# Patient Record
Sex: Female | Born: 1980
Health system: Southern US, Community
[De-identification: ages and names within clinical notes are randomized; demographics above are authoritative.]

## PROBLEM LIST (undated history)

## (undated) DIAGNOSIS — F32A Depression, unspecified: Secondary | ICD-10-CM

## (undated) DIAGNOSIS — F329 Major depressive disorder, single episode, unspecified: Secondary | ICD-10-CM

## (undated) DIAGNOSIS — Z8774 Personal history of (corrected) congenital malformations of heart and circulatory system: Secondary | ICD-10-CM

## (undated) DIAGNOSIS — J309 Allergic rhinitis, unspecified: Secondary | ICD-10-CM

## (undated) DIAGNOSIS — T7840XA Allergy, unspecified, initial encounter: Secondary | ICD-10-CM

## (undated) DIAGNOSIS — Q211 Atrial septal defect, unspecified: Secondary | ICD-10-CM

## (undated) DIAGNOSIS — M1990 Unspecified osteoarthritis, unspecified site: Secondary | ICD-10-CM

## (undated) DIAGNOSIS — E559 Vitamin D deficiency, unspecified: Secondary | ICD-10-CM

## (undated) DIAGNOSIS — Z8742 Personal history of other diseases of the female genital tract: Secondary | ICD-10-CM

## (undated) DIAGNOSIS — R519 Headache, unspecified: Secondary | ICD-10-CM

## (undated) DIAGNOSIS — IMO0002 Reserved for concepts with insufficient information to code with codable children: Secondary | ICD-10-CM

## (undated) DIAGNOSIS — R87619 Unspecified abnormal cytological findings in specimens from cervix uteri: Secondary | ICD-10-CM

## (undated) DIAGNOSIS — R51 Headache: Secondary | ICD-10-CM

## (undated) DIAGNOSIS — K7689 Other specified diseases of liver: Secondary | ICD-10-CM

## (undated) DIAGNOSIS — F410 Panic disorder [episodic paroxysmal anxiety] without agoraphobia: Secondary | ICD-10-CM

## (undated) DIAGNOSIS — E7800 Pure hypercholesterolemia, unspecified: Secondary | ICD-10-CM

## (undated) DIAGNOSIS — G478 Other sleep disorders: Secondary | ICD-10-CM

## (undated) DIAGNOSIS — O210 Mild hyperemesis gravidarum: Secondary | ICD-10-CM

## (undated) DIAGNOSIS — G4763 Sleep related bruxism: Secondary | ICD-10-CM

## (undated) HISTORY — DX: Unspecified osteoarthritis, unspecified site: M19.90

## (undated) HISTORY — DX: Pure hypercholesterolemia, unspecified: E78.00

## (undated) HISTORY — DX: Reserved for concepts with insufficient information to code with codable children: IMO0002

## (undated) HISTORY — DX: Panic disorder (episodic paroxysmal anxiety): F41.0

## (undated) HISTORY — DX: Personal history of (corrected) congenital malformations of heart and circulatory system: Z87.74

## (undated) HISTORY — DX: Headache, unspecified: R51.9

## (undated) HISTORY — DX: Other specified diseases of liver: K76.89

## (undated) HISTORY — DX: Mild hyperemesis gravidarum: O21.0

## (undated) HISTORY — PX: COLPOSCOPY: SHX161

## (undated) HISTORY — DX: Allergic rhinitis, unspecified: J30.9

## (undated) HISTORY — DX: Sleep related bruxism: G47.63

## (undated) HISTORY — DX: Depression, unspecified: F32.A

## (undated) HISTORY — DX: Personal history of other diseases of the female genital tract: Z87.42

## (undated) HISTORY — PX: ASD REPAIR: SHX258

## (undated) HISTORY — DX: Atrial septal defect, unspecified: Q21.10

## (undated) HISTORY — DX: Major depressive disorder, single episode, unspecified: F32.9

## (undated) HISTORY — DX: Atrial septal defect: Q21.1

## (undated) HISTORY — DX: Vitamin D deficiency, unspecified: E55.9

## (undated) HISTORY — DX: Headache: R51

## (undated) HISTORY — DX: Other sleep disorders: G47.8

## (undated) HISTORY — DX: Allergy, unspecified, initial encounter: T78.40XA

## (undated) HISTORY — DX: Unspecified abnormal cytological findings in specimens from cervix uteri: R87.619

---

## 2007-01-12 ENCOUNTER — Emergency Department (HOSPITAL_COMMUNITY): Admission: EM | Admit: 2007-01-12 | Discharge: 2007-01-12 | Payer: Self-pay | Admitting: Family Medicine

## 2007-04-28 ENCOUNTER — Emergency Department (HOSPITAL_COMMUNITY): Admission: EM | Admit: 2007-04-28 | Discharge: 2007-04-28 | Payer: Self-pay | Admitting: Emergency Medicine

## 2007-05-01 ENCOUNTER — Encounter: Admission: RE | Admit: 2007-05-01 | Discharge: 2007-05-01 | Payer: Self-pay | Admitting: Internal Medicine

## 2011-08-16 LAB — URINALYSIS, ROUTINE W REFLEX MICROSCOPIC
Glucose, UA: NEGATIVE
Ketones, ur: NEGATIVE
Nitrite: NEGATIVE
Protein, ur: NEGATIVE
Urobilinogen, UA: 0.2

## 2011-08-16 LAB — HEPATIC FUNCTION PANEL
ALT: 12
AST: 22
Alkaline Phosphatase: 34 — ABNORMAL LOW
Bilirubin, Direct: <0.1

## 2011-08-16 LAB — I-STAT 8, (EC8 V) (CONVERTED LAB)
Acid-base deficit: 1
Chloride: 107
Glucose, Bld: 95
Potassium: 4.2
TCO2: 27
pCO2, Ven: 46.1
pH, Ven: 7.349 — ABNORMAL HIGH

## 2011-08-16 LAB — CBC
HCT: 37
Hemoglobin: 12.6
MCHC: 34
MCV: 93.6
RBC: 3.95
WBC: 5.1

## 2011-08-16 LAB — POCT CARDIAC MARKERS
CKMB, poc: <1 — ABNORMAL LOW
Myoglobin, poc: 23.9
Operator id: 263631

## 2011-08-16 LAB — DIFFERENTIAL
Basophils Relative: 0
Eosinophils Absolute: 0
Lymphs Abs: 1.9
Monocytes Absolute: 0.5
Monocytes Relative: 9

## 2011-08-16 LAB — POCT PREGNANCY, URINE: Preg Test, Ur: NEGATIVE

## 2011-08-16 LAB — SEDIMENTATION RATE: Sed Rate: 2

## 2011-08-16 LAB — POCT I-STAT CREATININE: Operator id: 263631

## 2013-02-27 LAB — OB RESULTS CONSOLE RPR: RPR: NONREACTIVE

## 2013-02-27 LAB — OB RESULTS CONSOLE HIV ANTIBODY (ROUTINE TESTING): HIV: NONREACTIVE

## 2013-02-27 LAB — OB RESULTS CONSOLE GC/CHLAMYDIA
Chlamydia: NEGATIVE
Gonorrhea: NEGATIVE

## 2013-02-27 LAB — OB RESULTS CONSOLE RUBELLA ANTIBODY, IGM: Rubella: NON-IMMUNE/NOT IMMUNE

## 2013-03-28 ENCOUNTER — Inpatient Hospital Stay (HOSPITAL_COMMUNITY): Admission: AD | Admit: 2013-03-28 | Payer: Self-pay | Source: Ambulatory Visit | Admitting: Obstetrics and Gynecology

## 2013-09-23 LAB — OB RESULTS CONSOLE GBS: GBS: NEGATIVE

## 2013-10-29 ENCOUNTER — Telehealth (HOSPITAL_COMMUNITY): Payer: Self-pay | Admitting: *Deleted

## 2013-10-29 ENCOUNTER — Encounter (HOSPITAL_COMMUNITY): Payer: Self-pay | Admitting: *Deleted

## 2013-10-29 ENCOUNTER — Other Ambulatory Visit: Payer: Self-pay | Admitting: Obstetrics and Gynecology

## 2013-10-29 NOTE — Telephone Encounter (Signed)
Preadmission screen  

## 2013-11-01 ENCOUNTER — Inpatient Hospital Stay (HOSPITAL_COMMUNITY)
Admission: RE | Admit: 2013-11-01 | Discharge: 2013-11-05 | DRG: 765 | Disposition: A | Discharge status: Home / Self Care | Payer: BC Managed Care – PPO | Source: Ambulatory Visit | Attending: Obstetrics and Gynecology | Admitting: Obstetrics and Gynecology

## 2013-11-01 ENCOUNTER — Encounter (HOSPITAL_COMMUNITY): Payer: Self-pay

## 2013-11-01 DIAGNOSIS — O480 Post-term pregnancy: Principal | ICD-10-CM | POA: Diagnosis present

## 2013-11-01 DIAGNOSIS — D62 Acute posthemorrhagic anemia: Secondary | ICD-10-CM | POA: Diagnosis not present

## 2013-11-01 DIAGNOSIS — O9903 Anemia complicating the puerperium: Secondary | ICD-10-CM | POA: Diagnosis not present

## 2013-11-01 LAB — CBC
HCT: 36.7 % (ref 36.0–46.0)
Hemoglobin: 12.2 g/dL (ref 12.0–15.0)
MCH: 30.8 pg (ref 26.0–34.0)
MCHC: 33.2 g/dL (ref 30.0–36.0)
MCV: 92.7 fL (ref 78.0–100.0)
Platelets: 181 10*3/uL (ref 150–400)
RBC: 3.96 MIL/uL (ref 3.87–5.11)
RDW: 16.1 % — AB (ref 11.5–15.5)
WBC: 9.6 10*3/uL (ref 4.0–10.5)

## 2013-11-01 MED ORDER — IBUPROFEN 600 MG PO TABS
600.0000 mg | ORAL_TABLET | Freq: Four times a day (QID) | ORAL | Status: DC | PRN
Start: 2013-11-01 — End: 2013-11-03

## 2013-11-01 MED ORDER — OXYCODONE-ACETAMINOPHEN 5-325 MG PO TABS: 1.0000 | ORAL_TABLET | ORAL | Status: DC | PRN

## 2013-11-01 MED ORDER — ACETAMINOPHEN 325 MG PO TABS: 650.0000 mg | ORAL_TABLET | ORAL | Status: DC | PRN

## 2013-11-01 MED ORDER — ONDANSETRON HCL 4 MG/2ML IJ SOLN
4.0000 mg | Freq: Four times a day (QID) | INTRAMUSCULAR | Status: DC | PRN
  Administered 2013-11-02 – 2013-11-03 (×2): 4 mg via INTRAVENOUS
  Filled 2013-11-01 (×2): qty 2

## 2013-11-01 MED ORDER — LACTATED RINGERS IV SOLN
500.0000 mL | INTRAVENOUS | Status: DC | PRN
  Administered 2013-11-02: 1000 mL via INTRAVENOUS

## 2013-11-01 MED ORDER — ZOLPIDEM TARTRATE 5 MG PO TABS: 5.0000 mg | ORAL_TABLET | Freq: Every evening | ORAL | Status: DC | PRN

## 2013-11-01 MED ORDER — LIDOCAINE HCL (PF) 1 % IJ SOLN: 30.0000 mL | INTRAMUSCULAR | Status: DC | PRN

## 2013-11-01 MED ORDER — MISOPROSTOL 25 MCG QUARTER TABLET
25.0000 ug | ORAL_TABLET | ORAL | Status: DC | PRN
  Administered 2013-11-01 – 2013-11-02 (×2): 25 ug via VAGINAL
  Filled 2013-11-01 (×2): qty 0.25

## 2013-11-01 MED ORDER — CITRIC ACID-SODIUM CITRATE 334-500 MG/5ML PO SOLN
30.0000 mL | ORAL | Status: DC | PRN
  Administered 2013-11-03: 30 mL via ORAL
  Filled 2013-11-01: qty 15

## 2013-11-01 MED ORDER — LACTATED RINGERS IV SOLN
INTRAVENOUS | Status: DC
Start: 2013-11-01 — End: 2013-11-03
  Administered 2013-11-01: 20:00:00 via INTRAVENOUS
  Administered 2013-11-02: 1000 mL via INTRAVENOUS
  Administered 2013-11-02 – 2013-11-03 (×4): via INTRAVENOUS

## 2013-11-01 MED ORDER — BUTORPHANOL TARTRATE 1 MG/ML IJ SOLN
1.0000 mg | INTRAMUSCULAR | Status: DC | PRN
  Administered 2013-11-02 (×2): 1 mg via INTRAVENOUS
  Filled 2013-11-01 (×2): qty 1

## 2013-11-01 MED ORDER — TERBUTALINE SULFATE 1 MG/ML IJ SOLN: 0.2500 mg | Freq: Once | INTRAMUSCULAR | Status: AC | PRN

## 2013-11-01 MED ORDER — OXYTOCIN 40 UNITS IN LACTATED RINGERS INFUSION - SIMPLE MED
62.5000 mL/h | INTRAVENOUS | Status: DC
Start: 2013-11-01 — End: 2013-11-03

## 2013-11-01 MED ORDER — OXYTOCIN 10 UNIT/ML IJ SOLN: 10.0000 [IU] | Freq: Once | INTRAMUSCULAR | Status: DC

## 2013-11-01 MED ORDER — OXYTOCIN BOLUS FROM INFUSION: 500.0000 mL | INTRAVENOUS | Status: DC

## 2013-11-01 NOTE — H&P (Signed)
Holly Robles is a 33 y.o. female presenting for  IOL 2nd to postdates.. History OB History   Grav Para Term Preterm Abortions TAB SAB Ect Mult Living   1              Past Medical History  Diagnosis Date  . Depression   . Hyperemesis gravidarum   . Abnormal Pap smear   . Hx of female infertility   . Headache(784.0)     migraine  . ASD (atrial septal defect)    Past Surgical History  Procedure Laterality Date  . Asd repair    . Colposcopy     Family History: family history includes Cancer in her maternal grandmother and paternal grandmother; Diabetes in her maternal uncle; Holly Robles' disease in her brother; Heart attack in her maternal grandfather; Heart disease in her maternal grandfather and paternal grandfather; Heart failure in her paternal grandfather; Hyperlipidemia in her brother and father; Hypertension in her brother; Hyperthyroidism in her brother; Hypothyroidism in her mother. Social History:  reports that she has never smoked. She has never used smokeless tobacco. She reports that she does not drink alcohol or use illicit drugs.   Prenatal Transfer Tool  Maternal Diabetes: No Genetic Screening: Declined Maternal Ultrasounds/Referrals: Normal Fetal Ultrasounds or other Referrals:  Fetal echo Maternal Substance Abuse:  No Significant Maternal Medications:  None Significant Maternal Lab Results:  Lab values include: Group B Strep negative Other Comments:  maternal hx ASD repair  ROS neg    Blood pressure 124/91, pulse 93, height 5\' 4"  (1.626 m), weight 81.647 kg (180 lb), last menstrual period 01/19/2013. Exam Physical Exam  Constitutional: She is oriented to person, place, and time. She appears well-developed and well-nourished.  HENT:  Head: Atraumatic.  Neck: Neck supple.  Cardiovascular: Normal rate.   Respiratory: Breath sounds normal.  GI: Soft.  Musculoskeletal: She exhibits no edema.  Neurological: She is oriented to person, place, and time.  Skin:  Skin is warm and dry.  Psychiatric: She has a normal mood and affect.   VE closed/thick/-3 Prenatal labs: ABO, Rh: A/Positive/-- (05/01 0000) Antibody: Negative (05/01 0000) Rubella: Nonimmune (05/01 0000) RPR: Nonreactive (05/01 0000)  HBsAg: Negative (05/01 0000)  HIV: Non-reactive (05/01 0000)  GBS: Negative (11/25 0000)   Assessment/Plan: Postdates P) admit routine labs, cytotec. Analgesic prn pitocin prn  Annaleese Guier A 11/01/2013, 7:56 PM

## 2013-11-01 NOTE — Plan of Care (Signed)
Problem: Consults Goal: Birthing Suites Patient Information Press F2 to bring up selections list Outcome: Completed/Met Date Met:  11/01/13  Pt > [redacted] weeks EGA and Inpatient induction

## 2013-11-02 ENCOUNTER — Encounter (HOSPITAL_COMMUNITY): Payer: BC Managed Care – PPO | Admitting: Anesthesiology

## 2013-11-02 ENCOUNTER — Inpatient Hospital Stay (HOSPITAL_COMMUNITY): Payer: BC Managed Care – PPO | Admitting: Anesthesiology

## 2013-11-02 ENCOUNTER — Encounter (HOSPITAL_COMMUNITY): Payer: Self-pay

## 2013-11-02 MED ORDER — LACTATED RINGERS IV SOLN: 500.0000 mL | Freq: Once | INTRAVENOUS | Status: DC

## 2013-11-02 MED ORDER — FENTANYL 2.5 MCG/ML BUPIVACAINE 1/10 % EPIDURAL INFUSION (WH - ANES)
14.0000 mL/h | INTRAMUSCULAR | Status: DC | PRN
  Administered 2013-11-02 – 2013-11-03 (×2): 14 mL/h via EPIDURAL
  Filled 2013-11-02 (×3): qty 125

## 2013-11-02 MED ORDER — PHENYLEPHRINE 40 MCG/ML (10ML) SYRINGE FOR IV PUSH (FOR BLOOD PRESSURE SUPPORT)
80.0000 ug | PREFILLED_SYRINGE | INTRAVENOUS | Status: DC | PRN
  Administered 2013-11-02: 80 ug via INTRAVENOUS
  Filled 2013-11-02: qty 10

## 2013-11-02 MED ORDER — OXYTOCIN 40 UNITS IN LACTATED RINGERS INFUSION - SIMPLE MED
1.0000 m[IU]/min | INTRAVENOUS | Status: DC
  Administered 2013-11-02: 2 m[IU]/min via INTRAVENOUS
  Administered 2013-11-02: 4 m[IU]/min via INTRAVENOUS
  Filled 2013-11-02: qty 1000

## 2013-11-02 MED ORDER — EPHEDRINE 5 MG/ML INJ
10.0000 mg | INTRAVENOUS | Status: DC | PRN
  Filled 2013-11-02 (×2): qty 4

## 2013-11-02 MED ORDER — FENTANYL CITRATE 0.05 MG/ML IJ SOLN
INTRAMUSCULAR | Status: AC
  Filled 2013-11-02: qty 2

## 2013-11-02 MED ORDER — EPHEDRINE 5 MG/ML INJ
10.0000 mg | INTRAVENOUS | Status: DC | PRN
  Filled 2013-11-02: qty 4

## 2013-11-02 MED ORDER — TERBUTALINE SULFATE 1 MG/ML IJ SOLN: 0.2500 mg | Freq: Once | INTRAMUSCULAR | Status: AC | PRN

## 2013-11-02 MED ORDER — PHENYLEPHRINE 40 MCG/ML (10ML) SYRINGE FOR IV PUSH (FOR BLOOD PRESSURE SUPPORT)
80.0000 ug | PREFILLED_SYRINGE | INTRAVENOUS | Status: DC | PRN
  Filled 2013-11-02 (×2): qty 10

## 2013-11-02 MED ORDER — DIPHENHYDRAMINE HCL 50 MG/ML IJ SOLN: 12.5000 mg | INTRAMUSCULAR | Status: DC | PRN

## 2013-11-02 NOTE — Anesthesia Preprocedure Evaluation (Signed)
Anesthesia Evaluation  Patient identified by MRN, date of birth, ID band Patient awake    Reviewed: Allergy & Precautions, H&P , NPO status , Patient's Chart, lab work & pertinent test results  Airway Mallampati: II TM Distance: >3 FB Neck ROM: full    Dental no notable dental hx.    Pulmonary neg pulmonary ROS,    Pulmonary exam normal       Cardiovascular negative cardio ROS      Neuro/Psych    GI/Hepatic negative GI ROS, Neg liver ROS,   Endo/Other  negative endocrine ROS  Renal/GU negative Renal ROS  negative genitourinary   Musculoskeletal negative musculoskeletal ROS (+)   Abdominal Normal abdominal exam  (+)   Peds  Hematology negative hematology ROS (+)   Anesthesia Other Findings   Reproductive/Obstetrics (+) Pregnancy                           Anesthesia Physical Anesthesia Plan  ASA: II  Anesthesia Plan: Epidural   Post-op Pain Management:    Induction:   Airway Management Planned:   Additional Equipment:   Intra-op Plan:   Post-operative Plan:   Informed Consent: I have reviewed the patients History and Physical, chart, labs and discussed the procedure including the risks, benefits and alternatives for the proposed anesthesia with the patient or authorized representative who has indicated his/her understanding and acceptance.     Plan Discussed with:   Anesthesia Plan Comments:         Anesthesia Quick Evaluation

## 2013-11-02 NOTE — Progress Notes (Signed)
Barth KirksMeredith D Mallick is a 33 y.o. G1P0 at 501w0d by LMP admitted for induction of labor due to Post dates. Due date 12/28.  Subjective: No chief complaint on file.  Moaning with ctx c/o back pain Objective: BP 109/63  Pulse 79  Temp(Src) 97.8 F (36.6 C) (Oral)  Resp 20  Ht 5\' 4"  (1.626 m)  Wt 81.647 kg (180 lb)  BMI 30.88 kg/m2  LMP 01/19/2013      FHT:  FHR: 125 bpm, variability: moderate,  accelerations:  Present,  decelerations:  Absent UC:   regular, every 2 -3  minutes SVE:   2 cm dilated, 100% effaced, -3 station w/ balloon in place Tracing: cat 1  Labs: Lab Results  Component Value Date   WBC 9.6 11/01/2013   HGB 12.2 11/01/2013   HCT 36.7 11/01/2013   MCV 92.7 11/01/2013   PLT 181 11/01/2013    Assessment / Plan: Postdates P) cont present mgmt Anticipated MOD:  unknown  Miia Blanks A 11/02/2013, 1:57 PM

## 2013-11-02 NOTE — Progress Notes (Signed)
Holly Robles is a 33 y.o. G1P0 at 553w0d by LMP admitted for induction of labor due to Post dates. Due date 12/28.  Subjective: No chief complaint on file. s/p cytotec x 2 Pitocin 2 miu Breathing with ctx  Objective: BP 115/75  Pulse 79  Temp(Src) 98.2 F (36.8 C) (Oral)  Resp 20  Ht 5\' 4"  (1.626 m)  Wt 81.647 kg (180 lb)  BMI 30.88 kg/m2  LMP 01/19/2013      FHT:  FHR: 140 bpm, variability: moderate,  accelerations:  Present,  decelerations:  Absent UC:   irregular, every q2-4 minutes SVE:   1 cm dilated, 50%(thick) effaced, -3station Tracing: cat 1 Intracervical balloon placed  Labs: Lab Results  Component Value Date   WBC 9.6 11/01/2013   HGB 12.2 11/01/2013   HCT 36.7 11/01/2013   MCV 92.7 11/01/2013   PLT 181 11/01/2013    Assessment / Plan: Postdates P) continue pitocin. Amniotomy with more favorable cervix. Analgesic prn  Anticipated MOD:  unknown  Greycen Felter A 11/02/2013, 8:55 AM

## 2013-11-02 NOTE — Progress Notes (Signed)
Holly KirksMeredith D Robles is a 33 y.o. G1P0 at 3937w0d by LMP admitted for induction of labor due to Post dates. Due date 12/28.  Subjective: No chief complaint on file. Epidural SROM clear fluid @ 8:15p comfortable  Objective: BP 115/54  Pulse 79  Temp(Src) 98.4 F (36.9 C) (Oral)  Resp 20  Ht 5\' 4"  (1.626 m)  Wt 81.647 kg (180 lb)  BMI 30.88 kg/m2  SpO2 100%  LMP 01/19/2013   Total I/O In: -  Out: 200 [Urine:200]  FHT:  FHR: 140 bpm, variability: moderate,  accelerations:  Present,  decelerations:  Absent UC:   regular, every 2 1/2 -3 minutes SVE:   4 cm dilated, 80% effaced, -3 station Tracing: cat 1 IUPC placed  Labs: Lab Results  Component Value Date   WBC 9.6 11/01/2013   HGB 12.2 11/01/2013   HCT 36.7 11/01/2013   MCV 92.7 11/01/2013   PLT 181 11/01/2013    Assessment / Plan: Arrest in active phase of labor due to suboptimal ctx Postdates P) exaggerated right sims. Cont increase pitocin  Anticipated MOD:  unknown  Holly Robles A 11/02/2013, 9:59 PM

## 2013-11-03 ENCOUNTER — Encounter (HOSPITAL_COMMUNITY)
Admission: RE | Disposition: A | Discharge status: Home / Self Care | Payer: Self-pay | Source: Ambulatory Visit | Attending: Obstetrics and Gynecology

## 2013-11-03 ENCOUNTER — Encounter (HOSPITAL_COMMUNITY): Payer: Self-pay

## 2013-11-03 SURGERY — Surgical Case
Anesthesia: Epidural | Site: Abdomen

## 2013-11-03 MED ORDER — SIMETHICONE 80 MG PO CHEW
80.0000 mg | CHEWABLE_TABLET | ORAL | Status: DC
  Administered 2013-11-03 – 2013-11-04 (×2): 80 mg via ORAL
  Filled 2013-11-03 (×2): qty 1

## 2013-11-03 MED ORDER — SODIUM BICARBONATE 8.4 % IV SOLN
INTRAVENOUS | Status: DC | PRN
  Administered 2013-11-03 (×6): 5 mL via EPIDURAL

## 2013-11-03 MED ORDER — MIDAZOLAM HCL 2 MG/2ML IJ SOLN: 0.5000 mg | Freq: Once | INTRAMUSCULAR | Status: DC | PRN

## 2013-11-03 MED ORDER — DEXTROSE 5 % IN LACTATED RINGERS IV BOLUS
1000.0000 mL | Freq: Once | INTRAVENOUS | Status: AC
  Administered 2013-11-03: 1000 mL via INTRAVENOUS

## 2013-11-03 MED ORDER — 0.9 % SODIUM CHLORIDE (POUR BTL) OPTIME
TOPICAL | Status: DC | PRN
  Administered 2013-11-03: 1000 mL

## 2013-11-03 MED ORDER — MEPERIDINE HCL 25 MG/ML IJ SOLN: 6.2500 mg | INTRAMUSCULAR | Status: DC | PRN

## 2013-11-03 MED ORDER — KETOROLAC TROMETHAMINE 30 MG/ML IJ SOLN
30.0000 mg | Freq: Four times a day (QID) | INTRAMUSCULAR | Status: AC | PRN
Start: 2013-11-03 — End: 2013-11-04

## 2013-11-03 MED ORDER — ONDANSETRON HCL 4 MG/2ML IJ SOLN: 4.0000 mg | INTRAMUSCULAR | Status: DC | PRN

## 2013-11-03 MED ORDER — BUPIVACAINE HCL (PF) 0.25 % IJ SOLN
INTRAMUSCULAR | Status: DC | PRN
  Administered 2013-11-03: 9 mL

## 2013-11-03 MED ORDER — ONDANSETRON HCL 4 MG/2ML IJ SOLN
INTRAMUSCULAR | Status: AC
  Filled 2013-11-03: qty 2

## 2013-11-03 MED ORDER — NALBUPHINE HCL 10 MG/ML IJ SOLN
5.0000 mg | INTRAMUSCULAR | Status: DC | PRN
  Filled 2013-11-03: qty 1

## 2013-11-03 MED ORDER — METOCLOPRAMIDE HCL 5 MG/ML IJ SOLN
INTRAMUSCULAR | Status: DC | PRN
  Administered 2013-11-03 (×2): 5 mg via INTRAVENOUS

## 2013-11-03 MED ORDER — MEPERIDINE HCL 25 MG/ML IJ SOLN
6.2500 mg | INTRAMUSCULAR | Status: DC | PRN
Start: 2013-11-03 — End: 2013-11-03

## 2013-11-03 MED ORDER — IBUPROFEN 600 MG PO TABS: 600.0000 mg | ORAL_TABLET | Freq: Four times a day (QID) | ORAL | Status: DC | PRN

## 2013-11-03 MED ORDER — BUPIVACAINE HCL (PF) 0.25 % IJ SOLN
INTRAMUSCULAR | Status: AC
  Filled 2013-11-03: qty 30

## 2013-11-03 MED ORDER — BUPROPION HCL ER (XL) 150 MG PO TB24
150.0000 mg | ORAL_TABLET | Freq: Every day | ORAL | Status: DC
  Administered 2013-11-04 – 2013-11-05 (×2): 150 mg via ORAL
  Filled 2013-11-03 (×3): qty 1

## 2013-11-03 MED ORDER — DIPHENHYDRAMINE HCL 50 MG/ML IJ SOLN: 12.5000 mg | INTRAMUSCULAR | Status: DC | PRN

## 2013-11-03 MED ORDER — NALOXONE HCL 1 MG/ML IJ SOLN
1.0000 ug/kg/h | INTRAVENOUS | Status: DC | PRN
  Filled 2013-11-03: qty 2

## 2013-11-03 MED ORDER — WITCH HAZEL-GLYCERIN EX PADS: 1.0000 "application " | MEDICATED_PAD | CUTANEOUS | Status: DC | PRN

## 2013-11-03 MED ORDER — MEASLES, MUMPS & RUBELLA VAC ~~LOC~~ INJ
0.5000 mL | INJECTION | Freq: Once | SUBCUTANEOUS | Status: AC
  Administered 2013-11-05: 0.5 mL via SUBCUTANEOUS
  Filled 2013-11-03 (×2): qty 0.5

## 2013-11-03 MED ORDER — IBUPROFEN 600 MG PO TABS
600.0000 mg | ORAL_TABLET | Freq: Four times a day (QID) | ORAL | Status: DC
  Administered 2013-11-03 – 2013-11-05 (×7): 600 mg via ORAL
  Filled 2013-11-03 (×7): qty 1

## 2013-11-03 MED ORDER — KETOROLAC TROMETHAMINE 30 MG/ML IJ SOLN: 30.0000 mg | Freq: Four times a day (QID) | INTRAMUSCULAR | Status: AC | PRN

## 2013-11-03 MED ORDER — PHENYLEPHRINE 8 MG IN D5W 100 ML (0.08MG/ML) PREMIX OPTIME: INJECTION | INTRAVENOUS | Status: DC | PRN

## 2013-11-03 MED ORDER — KETOROLAC TROMETHAMINE 30 MG/ML IJ SOLN
15.0000 mg | Freq: Once | INTRAMUSCULAR | Status: AC | PRN
  Administered 2013-11-03: 30 mg via INTRAVENOUS

## 2013-11-03 MED ORDER — ZOLPIDEM TARTRATE 5 MG PO TABS: 5.0000 mg | ORAL_TABLET | Freq: Every evening | ORAL | Status: DC | PRN

## 2013-11-03 MED ORDER — ONDANSETRON HCL 4 MG/2ML IJ SOLN: 4.0000 mg | Freq: Three times a day (TID) | INTRAMUSCULAR | Status: DC | PRN

## 2013-11-03 MED ORDER — LANOLIN HYDROUS EX OINT: 1.0000 "application " | TOPICAL_OINTMENT | CUTANEOUS | Status: DC | PRN

## 2013-11-03 MED ORDER — FENTANYL CITRATE 0.05 MG/ML IJ SOLN
INTRAMUSCULAR | Status: DC | PRN
  Administered 2013-11-03: 100 ug via EPIDURAL

## 2013-11-03 MED ORDER — ACETAMINOPHEN 500 MG PO TABS
1000.0000 mg | ORAL_TABLET | Freq: Four times a day (QID) | ORAL | Status: AC
  Administered 2013-11-03 (×2): 1000 mg via ORAL
  Filled 2013-11-03 (×3): qty 2

## 2013-11-03 MED ORDER — PRENATAL MULTIVITAMIN CH
1.0000 | ORAL_TABLET | Freq: Every day | ORAL | Status: DC
  Administered 2013-11-03 – 2013-11-04 (×2): 1 via ORAL
  Filled 2013-11-03 (×2): qty 1

## 2013-11-03 MED ORDER — METHYLERGONOVINE MALEATE 0.2 MG PO TABS: 0.2000 mg | ORAL_TABLET | ORAL | Status: DC | PRN

## 2013-11-03 MED ORDER — LACTATED RINGERS IV SOLN
INTRAVENOUS | Status: DC | PRN
  Administered 2013-11-03: 08:00:00 via INTRAVENOUS

## 2013-11-03 MED ORDER — OXYCODONE-ACETAMINOPHEN 5-325 MG PO TABS
1.0000 | ORAL_TABLET | ORAL | Status: DC | PRN
  Administered 2013-11-04 – 2013-11-05 (×3): 1 via ORAL
  Filled 2013-11-03 (×3): qty 1

## 2013-11-03 MED ORDER — DIPHENHYDRAMINE HCL 50 MG/ML IJ SOLN: 25.0000 mg | INTRAMUSCULAR | Status: DC | PRN

## 2013-11-03 MED ORDER — SODIUM CHLORIDE 0.9 % IJ SOLN
3.0000 mL | Freq: Two times a day (BID) | INTRAMUSCULAR | Status: DC
  Administered 2013-11-03: 3 mL via INTRAVENOUS

## 2013-11-03 MED ORDER — DIPHENHYDRAMINE HCL 25 MG PO CAPS
25.0000 mg | ORAL_CAPSULE | Freq: Four times a day (QID) | ORAL | Status: DC | PRN
  Administered 2013-11-03: 25 mg via ORAL

## 2013-11-03 MED ORDER — OXYTOCIN 10 UNIT/ML IJ SOLN
40.0000 [IU] | INTRAVENOUS | Status: DC | PRN
  Administered 2013-11-03: 40 [IU] via INTRAVENOUS

## 2013-11-03 MED ORDER — DIBUCAINE 1 % RE OINT: 1.0000 "application " | TOPICAL_OINTMENT | RECTAL | Status: DC | PRN

## 2013-11-03 MED ORDER — FENTANYL CITRATE 0.05 MG/ML IJ SOLN: 25.0000 ug | INTRAMUSCULAR | Status: DC | PRN

## 2013-11-03 MED ORDER — SIMETHICONE 80 MG PO CHEW
80.0000 mg | CHEWABLE_TABLET | Freq: Three times a day (TID) | ORAL | Status: DC
  Administered 2013-11-03 – 2013-11-05 (×5): 80 mg via ORAL
  Filled 2013-11-03 (×5): qty 1

## 2013-11-03 MED ORDER — METHYLERGONOVINE MALEATE 0.2 MG/ML IJ SOLN: 0.2000 mg | INTRAMUSCULAR | Status: DC | PRN

## 2013-11-03 MED ORDER — ONDANSETRON HCL 4 MG PO TABS
4.0000 mg | ORAL_TABLET | ORAL | Status: DC | PRN
Start: 2013-11-03 — End: 2013-11-05

## 2013-11-03 MED ORDER — CEFAZOLIN SODIUM-DEXTROSE 2-3 GM-% IV SOLR
INTRAVENOUS | Status: AC
  Filled 2013-11-03: qty 50

## 2013-11-03 MED ORDER — SCOPOLAMINE 1 MG/3DAYS TD PT72
MEDICATED_PATCH | TRANSDERMAL | Status: AC
  Filled 2013-11-03: qty 1

## 2013-11-03 MED ORDER — FLEET ENEMA 7-19 GM/118ML RE ENEM: 1.0000 | ENEMA | Freq: Every day | RECTAL | Status: DC | PRN

## 2013-11-03 MED ORDER — BISACODYL 10 MG RE SUPP: 10.0000 mg | Freq: Every day | RECTAL | Status: DC | PRN

## 2013-11-03 MED ORDER — ONDANSETRON HCL 4 MG/2ML IJ SOLN
INTRAMUSCULAR | Status: DC | PRN
  Administered 2013-11-03: 4 mg via INTRAVENOUS

## 2013-11-03 MED ORDER — OXYTOCIN 40 UNITS IN LACTATED RINGERS INFUSION - SIMPLE MED: 62.5000 mL/h | INTRAVENOUS | Status: AC

## 2013-11-03 MED ORDER — SENNOSIDES-DOCUSATE SODIUM 8.6-50 MG PO TABS
2.0000 | ORAL_TABLET | ORAL | Status: DC
  Administered 2013-11-03 – 2013-11-04 (×2): 2 via ORAL
  Filled 2013-11-03 (×3): qty 2

## 2013-11-03 MED ORDER — PROMETHAZINE HCL 25 MG/ML IJ SOLN: 6.2500 mg | INTRAMUSCULAR | Status: DC | PRN

## 2013-11-03 MED ORDER — FERROUS SULFATE 325 (65 FE) MG PO TABS
325.0000 mg | ORAL_TABLET | Freq: Two times a day (BID) | ORAL | Status: DC
  Administered 2013-11-03 – 2013-11-05 (×4): 325 mg via ORAL
  Filled 2013-11-03 (×4): qty 1

## 2013-11-03 MED ORDER — OXYTOCIN 10 UNIT/ML IJ SOLN
INTRAMUSCULAR | Status: AC
  Filled 2013-11-03: qty 4

## 2013-11-03 MED ORDER — METOCLOPRAMIDE HCL 5 MG/ML IJ SOLN: 10.0000 mg | Freq: Three times a day (TID) | INTRAMUSCULAR | Status: DC | PRN

## 2013-11-03 MED ORDER — SODIUM CHLORIDE 0.9 % IJ SOLN
3.0000 mL | INTRAMUSCULAR | Status: DC | PRN
  Administered 2013-11-03: 3 mL via INTRAVENOUS

## 2013-11-03 MED ORDER — KETOROLAC TROMETHAMINE 30 MG/ML IJ SOLN
INTRAMUSCULAR | Status: AC
  Filled 2013-11-03: qty 1

## 2013-11-03 MED ORDER — CEFAZOLIN SODIUM-DEXTROSE 2-3 GM-% IV SOLR
INTRAVENOUS | Status: DC | PRN
  Administered 2013-11-03: 2 g via INTRAVENOUS

## 2013-11-03 MED ORDER — SCOPOLAMINE 1 MG/3DAYS TD PT72
1.0000 | MEDICATED_PATCH | Freq: Once | TRANSDERMAL | Status: DC
  Administered 2013-11-03: 1.5 mg via TRANSDERMAL

## 2013-11-03 MED ORDER — SIMETHICONE 80 MG PO CHEW: 80.0000 mg | CHEWABLE_TABLET | ORAL | Status: DC | PRN

## 2013-11-03 MED ORDER — DIPHENHYDRAMINE HCL 25 MG PO CAPS
25.0000 mg | ORAL_CAPSULE | ORAL | Status: DC | PRN
  Filled 2013-11-03: qty 1

## 2013-11-03 MED ORDER — PHENYLEPHRINE 40 MCG/ML (10ML) SYRINGE FOR IV PUSH (FOR BLOOD PRESSURE SUPPORT)
PREFILLED_SYRINGE | INTRAVENOUS | Status: AC
  Filled 2013-11-03: qty 20

## 2013-11-03 MED ORDER — NALOXONE HCL 0.4 MG/ML IJ SOLN: 0.4000 mg | INTRAMUSCULAR | Status: DC | PRN

## 2013-11-03 MED ORDER — MENTHOL 3 MG MT LOZG: 1.0000 | LOZENGE | OROMUCOSAL | Status: DC | PRN

## 2013-11-03 MED ORDER — MORPHINE SULFATE (PF) 0.5 MG/ML IJ SOLN
INTRAMUSCULAR | Status: DC | PRN
  Administered 2013-11-03: 4 mg via EPIDURAL
  Administered 2013-11-03: 1 mg via INTRAVENOUS

## 2013-11-03 MED ORDER — SODIUM CHLORIDE 0.9 % IV SOLN: 250.0000 mL | INTRAVENOUS | Status: DC

## 2013-11-03 MED ORDER — METOCLOPRAMIDE HCL 5 MG/ML IJ SOLN
INTRAMUSCULAR | Status: AC
  Filled 2013-11-03: qty 2

## 2013-11-03 MED ORDER — BUPIVACAINE HCL (PF) 0.25 % IJ SOLN
INTRAMUSCULAR | Status: DC | PRN
Start: 2013-11-03 — End: 2013-11-03
  Administered 2013-11-03: 8 mL via EPIDURAL

## 2013-11-03 MED ORDER — SODIUM CHLORIDE 0.9 % IJ SOLN: 3.0000 mL | INTRAMUSCULAR | Status: DC | PRN

## 2013-11-03 MED ORDER — CEFAZOLIN SODIUM-DEXTROSE 2-3 GM-% IV SOLR
2.0000 g | INTRAVENOUS | Status: AC
Start: 2013-11-03 — End: 2013-11-04
  Filled 2013-11-03: qty 50

## 2013-11-03 MED ORDER — MORPHINE SULFATE 0.5 MG/ML IJ SOLN
INTRAMUSCULAR | Status: AC
  Filled 2013-11-03: qty 10

## 2013-11-03 MED ORDER — PHENYLEPHRINE HCL 10 MG/ML IJ SOLN
INTRAMUSCULAR | Status: DC | PRN
  Administered 2013-11-03: 80 ug via INTRAVENOUS
  Administered 2013-11-03: 40 ug via INTRAVENOUS
  Administered 2013-11-03 (×2): 80 ug via INTRAVENOUS
  Administered 2013-11-03: 40 ug via INTRAVENOUS
  Administered 2013-11-03 (×2): 80 ug via INTRAVENOUS
  Administered 2013-11-03 (×2): 40 ug via INTRAVENOUS
  Administered 2013-11-03 (×3): 80 ug via INTRAVENOUS

## 2013-11-03 SURGICAL SUPPLY — 46 items
APL SKNCLS STERI-STRIP NONHPOA (GAUZE/BANDAGES/DRESSINGS)
BARRIER ADHS 3X4 INTERCEED (GAUZE/BANDAGES/DRESSINGS) ×3 IMPLANT
BENZOIN TINCTURE PRP APPL 2/3 (GAUZE/BANDAGES/DRESSINGS) IMPLANT
BRR ADH 4X3 ABS CNTRL BYND (GAUZE/BANDAGES/DRESSINGS) ×1
CLAMP CORD UMBIL (MISCELLANEOUS) ×2 IMPLANT
CLOSURE WOUND 1/2 X4 (GAUZE/BANDAGES/DRESSINGS)
CLOTH BEACON ORANGE TIMEOUT ST (SAFETY) ×3 IMPLANT
CONTAINER PREFILL 10% NBF 15ML (MISCELLANEOUS) IMPLANT
DRAPE LG THREE QUARTER DISP (DRAPES) ×2 IMPLANT
DRSG OPSITE POSTOP 4X10 (GAUZE/BANDAGES/DRESSINGS) ×3 IMPLANT
DURAPREP 26ML APPLICATOR (WOUND CARE) ×3 IMPLANT
ELECT REM PT RETURN 9FT ADLT (ELECTROSURGICAL) ×3
ELECTRODE REM PT RTRN 9FT ADLT (ELECTROSURGICAL) ×1 IMPLANT
EXTRACTOR VACUUM M CUP 4 TUBE (SUCTIONS) IMPLANT
EXTRACTOR VACUUM M CUP 4' TUBE (SUCTIONS)
GLOVE BIOGEL PI IND STRL 7.0 (GLOVE) ×1 IMPLANT
GLOVE BIOGEL PI INDICATOR 7.0 (GLOVE) ×2
GLOVE ECLIPSE 6.5 STRL STRAW (GLOVE) ×3 IMPLANT
GOWN PREVENTION PLUS XLARGE (GOWN DISPOSABLE) ×6 IMPLANT
GOWN STRL REIN XL XLG (GOWN DISPOSABLE) ×8 IMPLANT
KIT ABG SYR 3ML LUER SLIP (SYRINGE) IMPLANT
NDL HYPO 25X1 1.5 SAFETY (NEEDLE) ×1 IMPLANT
NDL HYPO 25X5/8 SAFETYGLIDE (NEEDLE) IMPLANT
NEEDLE HYPO 25X1 1.5 SAFETY (NEEDLE) ×3 IMPLANT
NEEDLE HYPO 25X5/8 SAFETYGLIDE (NEEDLE) IMPLANT
NS IRRIG 1000ML POUR BTL (IV SOLUTION) ×3 IMPLANT
PACK C SECTION WH (CUSTOM PROCEDURE TRAY) ×3 IMPLANT
PAD OB MATERNITY 4.3X12.25 (PERSONAL CARE ITEMS) ×3 IMPLANT
RTRCTR C-SECT PINK 25CM LRG (MISCELLANEOUS) IMPLANT
STAPLER VISISTAT 35W (STAPLE) IMPLANT
STRIP CLOSURE SKIN 1/2X4 (GAUZE/BANDAGES/DRESSINGS) IMPLANT
SUT CHROMIC GUT AB #0 18 (SUTURE) IMPLANT
SUT MNCRL 0 VIOLET CTX 36 (SUTURE) ×3 IMPLANT
SUT MON AB 4-0 PS1 27 (SUTURE) IMPLANT
SUT MONOCRYL 0 CTX 36 (SUTURE) ×6
SUT PLAIN 2 0 (SUTURE)
SUT PLAIN 2 0 XLH (SUTURE) ×2 IMPLANT
SUT PLAIN ABS 2-0 CT1 27XMFL (SUTURE) IMPLANT
SUT VIC AB 0 CT1 36 (SUTURE) ×6 IMPLANT
SUT VIC AB 2-0 CT1 27 (SUTURE) ×3
SUT VIC AB 2-0 CT1 TAPERPNT 27 (SUTURE) ×1 IMPLANT
SUT VIC AB 4-0 PS2 27 (SUTURE) IMPLANT
SYR CONTROL 10ML LL (SYRINGE) ×3 IMPLANT
TOWEL OR 17X24 6PK STRL BLUE (TOWEL DISPOSABLE) ×3 IMPLANT
TRAY FOLEY CATH 14FR (SET/KITS/TRAYS/PACK) IMPLANT
WATER STERILE IRR 1000ML POUR (IV SOLUTION) ×1 IMPLANT

## 2013-11-03 NOTE — Transfer of Care (Signed)
Immediate Anesthesia Transfer of Care Note  Patient: Holly Robles  Procedure(s) Performed: Procedure(s): CESAREAN SECTION (N/A)  Patient Location: PACU  Anesthesia Type:Epidural  Level of Consciousness: awake, alert  and oriented  Airway & Oxygen Therapy: Patient Spontanous Breathing  Post-op Assessment: Report given to PACU RN and Post -op Vital signs reviewed and stable  Post vital signs: Reviewed and stable  Complications: No apparent anesthesia complications

## 2013-11-03 NOTE — Progress Notes (Signed)
Patient was referred for history of depression/anxiety. * Referral screened out by Clinical Social Worker because none of the following criteria appear to apply:  ~ History of anxiety/depression during this pregnancy, or of post-partum depression.  ~ Diagnosis of anxiety and/or depression within last 3 years  ~ History of depression due to pregnancy loss/loss of child  OR * Patient's symptoms currently being treated with medication (Wellbutrin) and/or therapy.  Please contact the Clinical Social Worker if needs arise, or by the patient's request.  

## 2013-11-03 NOTE — Anesthesia Postprocedure Evaluation (Signed)
Anesthesia Post Note  Patient: Barth KirksMeredith D Agrawal  Procedure(s) Performed: Procedure(s) (LRB): CESAREAN SECTION (N/A)  Anesthesia type: Epidural  Patient location: PACU  Post pain: Pain level controlled  Post assessment: Post-op Vital signs reviewed  Last Vitals:  Filed Vitals:   11/03/13 0830  BP: 107/64  Pulse: 108  Temp:   Resp: 20    Post vital signs: Reviewed  Level of consciousness: awake  Complications: No apparent anesthesia complications

## 2013-11-03 NOTE — Anesthesia Postprocedure Evaluation (Signed)
Anesthesia Post Note  Patient: @Holly Robles @Holly Robles   Procedure(s) Performed: CLE/C/S  Anesthesia type: Epidural  Patient location: Mother/Baby  Post pain: Pain level controlled  Post assessment: Post-op Vital signs reviewed  Last Vitals: BP 108/64  Pulse 98  Temp(Src) 37 C (Oral)  Resp 20  Ht 5\' 4"  (1.626 m)  Wt 180 lb (81.647 kg)  BMI 30.88 kg/m2  SpO2 99%  LMP 01/19/2013  Post vital signs: Reviewed  Level of consciousness: awake  Complications: No apparent anesthesia complications

## 2013-11-03 NOTE — Progress Notes (Signed)
Holly Robles is a 33 y.o. G1P0 at 1245w1d by LMP admitted for induction of labor due to Post dates. Due date 12/28.  Subjective: No chief complaint on file.  Pain  Returning. Moaning w/ ctx.  Pitocin 28 miu  Objective: BP 136/88  Pulse 110  Temp(Src) 99.5 F (37.5 C) (Oral)  Resp 20  Ht 5\' 4"  (1.626 m)  Wt 81.647 kg (180 lb)  BMI 30.88 kg/m2  SpO2 99%  LMP 01/19/2013   Total I/O In: 245 [P.O.:245] Out: 850 [Urine:850]  FHT:  FHR: 150 bpm, variability: moderate,  accelerations:  Present,  decelerations:  Absent UC:   regular, every 1 1/2- 2 minutes SVE:   6 cm dilated, 95% effaced, -2 station Tracing: cat 1  Labs: Lab Results  Component Value Date   WBC 9.6 11/01/2013   HGB 12.2 11/01/2013   HCT 36.7 11/01/2013   MCV 92.7 11/01/2013   PLT 181 11/01/2013    Assessment / Plan: Arrest in active phase of labor Postdates  P) disc with pt and husband lack of change w/ adequate labor. Agree with recommend primary C/S. Risk of C/S reviewed: infection, bleeding, injury to surrounding organ structures, internal scar tissue, poss risk of blood transfusion. Consent signed after all ? answered  Anticipated MOD:  C/S  [name] A 11/03/2013, 6:51 AM

## 2013-11-03 NOTE — Progress Notes (Signed)
Holly Robles Holly Robles is a 33 y.o. G1P0 at 5421w1d by LMP admitted for induction of labor due to Post dates. Due date 12/28.  Subjective: No chief complaint on file. c/o rectal pressure Epidural Pitocin 24 miu  Objective: BP 132/75  Pulse 90  Temp(Src) 99.6 F (37.6 C) (Oral)  Resp 20  Ht 5\' 4"  (1.626 m)  Wt 81.647 kg (180 lb)  BMI 30.88 kg/m2  SpO2 99%  LMP 01/19/2013   Total I/O In: 245 [P.O.:245] Out: 850 [Urine:850]  FHT:  FHR: 150 bpm, variability: moderate,  accelerations:  Present,  decelerations:  Present early decel UC:   regular, every 1 1/2- 2 minutes (125-140 MVU) SVE:   6 cm dilated, 90% effaced, -2 station   Labs: Lab Results  Component Value Date   WBC 9.6 11/01/2013   HGB 12.2 11/01/2013   HCT 36.7 11/01/2013   MCV 92.7 11/01/2013   PLT 181 11/01/2013    Assessment / Plan: Protracted active phase  Due to suboptimal labor Postdates Low grade temp due to dehydration SROM <12 hrs( GBS cx neg)  P) cont present mgmt. IVF bolus. Analgesic via epidural ucx  Anticipated MOD:  unknown  Holly Robles A 11/03/2013, 4:10 AM

## 2013-11-03 NOTE — Brief Op Note (Signed)
11/01/2013 - 11/03/2013  8:17 AM  PATIENT:  Holly Robles  33 y.o. female  PRE-OPERATIVE DIAGNOSIS:  arrest of active phase of labor, post dates  POST-OPERATIVE DIAGNOSIS:  arrest of active phase of labor, postdates, ROP presentation  PROCEDURE:  Primary cesarean section, kerr hysterotomy  SURGEON:  Surgeon(s) and Role:    * Christinamarie Tall Cathie BeamsA Charlsey Moragne, MD - Primary  PHYSICIAN ASSISTANT:   ASSISTANTS: Marlinda Mikeanya Bailey, CNM   ANESTHESIA:   epidural  Findings: live female ROP, nl tubes and ovaries, CAN x1   EBL:  Total I/O In: 1500 [I.V.:1500] Out: 700 [Urine:100; Blood:600]  BLOOD ADMINISTERED:none  DRAINS: none   LOCAL MEDICATIONS USED:  MARCAINE     SPECIMEN:  Source of Specimen:  placenta  DISPOSITION OF SPECIMEN:  N/A  COUNTS:  YES  TOURNIQUET:  * No tourniquets in log *  DICTATION: .Other Dictation: Dictation Number [id number]  PLAN OF CARE: Admit to inpatient   PATIENT DISPOSITION:  PACU - hemodynamically stable.   Delay start of Pharmacological VTE agent (>24hrs) due to surgical blood loss or risk of bleeding: no

## 2013-11-03 NOTE — Op Note (Signed)
[name], Holly Robles              ACCOUNT NO.:  [id number]  MEDICAL RECORD NO.:  [id number]  LOCATION:  [facility]                          FACILITY:  [facility]  PHYSICIAN:  Maxie Better, M.D.DATE OF BIRTH:  26-Apr-1981  DATE OF PROCEDURE:  11/03/2013 DATE OF DISCHARGE:                              OPERATIVE REPORT   PREOPERATIVE DIAGNOSIS:  Arrest of dilatation, postdates.  PROCEDURE:  Primary cesarean section, Kerr hysterotomy.  POSTOPERATIVE DIAGNOSIS:  Right occiput posterior presentation, postdates arrest of dilatation.  ANESTHESIA:  Epidural.  SURGEON:  Maxie Better, MD  ASSISTANT:  Marlinda Mike.  DESCRIPTION OF PROCEDURE:  Under adequate epidural anesthesia, the patient was placed in the supine position with a left lateral tilt.  She was sterilely prepped and draped in usual fashion.  An indwelling Foley catheter was already in place, but draining blood-tinged urine.  She had 0.25% Marcaine was injected along the planned Pfannenstiel skin incision site.  Pfannenstiel skin incision was then made, carried down to the rectus fascia.  Rectus fascia was opened transversely.  Rectus fascia was then bluntly and sharply dissected off the rectus muscle in superior and inferior fashion.  The rectus muscle was split in midline.  The parietal peritoneum was entered bluntly and extended.  The bladder was noted to be distended and a Foley up in the field consistent with an inadequate draining Foley which was subsequently adjusted.  Bladder retractor was then placed.  Curvilinear low transverse uterine incision was made for the vesicouterine peritoneum to be developed.  The bladder was then bluntly dissected off the lower uterine segment and the bladder retractor was then readjusted.  A curvilinear low transverse uterine incision was then made, and extended with bandage scissors.  Nuchal cord was noted.  Subsequent delivery of a live female from the right occiput posterior  presentation with a cord around the neck which was reducible was accomplished.  Baby was bulb suctioned in the abdomen.  Cord was clamped, cut.  The baby was transferred to the awaiting pediatrician who assigned Apgars of 9 and 9 at 1 and 5 minutes.  The placenta was spontaneous, intact, not sent to Pathology.  Uterine cavity was cleaned of debris.  Uterine incision had no extension.  The incision was closed in 2 layers, the first layer was 0 Monocryl running locked stitch, second layer was imbricating 0 Monocryl suture.  Normal tubes and ovaries were noted bilaterally.  The uterus was manually massaged as well as intravenous Pitocin given.  The abdomen was then copiously irrigated and suctioned debris.  Interceed was placed over the lower uterine segment in a reverse T-fashion.  The parietal peritoneum was then closed with 2-0 Vicryl, the rectus fascia was closed with 0 Vicryl x2.  The subcutaneous area was irrigated, small bleeders cauterized, interrupted 2-0 plain sutures placed and the skin approximated with Ethicon staples.  SPECIMENS:  Placenta not sent to Pathology.  ESTIMATED BLOOD LOSS:  600 mL.  INTRAOPERATIVE FLUIDS:  1500 mL.  URINE OUTPUT:  100 mL, still blood-tinged urine.  Sponge and instrument counts x2 was correct.  COMPLICATION:  None.  The patient tolerated procedure well, was transferred to recovery in stable condition.     Holly Robles  Holly Robles, M.D.     Snyder/MEDQ  D:  11/03/2013  T:  11/03/2013  Job:  [id number]

## 2013-11-04 ENCOUNTER — Encounter (HOSPITAL_COMMUNITY): Payer: Self-pay | Admitting: Obstetrics and Gynecology

## 2013-11-04 LAB — CBC
HCT: 28.7 % — ABNORMAL LOW (ref 36.0–46.0)
HEMOGLOBIN: 9.6 g/dL — AB (ref 12.0–15.0)
MCH: 30.6 pg (ref 26.0–34.0)
MCHC: 33.4 g/dL (ref 30.0–36.0)
MCV: 91.4 fL (ref 78.0–100.0)
Platelets: 135 10*3/uL — ABNORMAL LOW (ref 150–400)
RBC: 3.14 MIL/uL — ABNORMAL LOW (ref 3.87–5.11)
RDW: 16.4 % — ABNORMAL HIGH (ref 11.5–15.5)
WBC: 13.2 10*3/uL — ABNORMAL HIGH (ref 4.0–10.5)

## 2013-11-04 LAB — CULTURE, OB URINE
COLONY COUNT: NO GROWTH
Culture: NO GROWTH
Special Requests: NORMAL

## 2013-11-04 LAB — BIRTH TISSUE RECOVERY COLLECTION (PLACENTA DONATION)

## 2013-11-04 NOTE — Lactation Note (Signed)
This note was copied from the chart of Holly Donald SivaMeredith Thurlow. Lactation Consultation Note Follow up consult:  Mother called to view latch.  Baby latched easily in football position with some minor adjustments, pulling baby deeper onto breast, aligning nose to nipple.  Swallows were heard.  Mother is having some mild discomfort on nipple and has been using lanolin.   Recommended she hand express milk to help with soreness also.  After the baby came off the breast the second time the mother's nipple was compressed.  Encouraged mom to hold down chin after latching baby and holding her close with deep wide latch.  Attempted other breast but baby was fussy.  Encouraged mother to call for further assistance and questions.   Patient Name: Holly Robles [id number]'s Date: 11/04/2013 Reason for consult: Follow-up assessment   Maternal Data    Feeding Feeding Type: Breast Fed Length of feed: 26 min  LATCH Score/Interventions Latch: Grasps breast easily, tongue down, lips flanged, rhythmical sucking. Intervention(s): Assist with latch;Adjust position  Audible Swallowing: A few with stimulation Intervention(s): Hand expression Intervention(s): Alternate breast massage  Type of Nipple: Everted at rest and after stimulation  Comfort (Breast/Nipple): Filling, red/small blisters or bruises, mild/mod discomfort  Problem noted: Mild/Moderate discomfort  Hold (Positioning): Assistance needed to correctly position infant at breast and maintain latch. Intervention(s): Support Pillows;Position options  LATCH Score: 7  Lactation Tools Discussed/Used Tools: Other (comment) (hand express milk)   Consult Status Consult Status: Follow-up Date: 11/05/13 Follow-up type: In-patient    Dahlia ByesBerkelhammer, Ajna Moors [facility] 11/04/2013, 7:31 PM

## 2013-11-04 NOTE — Lactation Note (Signed)
This note was copied from the chart of Holly Donald SivaMeredith Katona. Lactation Consultation Note Follow up consult:  [name] 7133 hours old and sleeping.  Mother will call lactation when baby wakes to view latch.  Parents had lots of questions particularly about pumping and pacifiers.  Mom will be going back to work in approx. 10 weeks and has a medela DEBP.  Encouraged mom to enjoy her baby and feed on cue, wait 3 weeks before introducing a pacifier if that is what they want to do and attend the support groups to gather information about pumping and going back to work.  Reviewed hand pump and DEBP basics, wide deep latch.  Lacation will follow up. Patient Name: Holly Robles [id number]'s Date: 11/04/2013 Reason for consult: Follow-up assessment   Maternal Data    Feeding Feeding Type: Breast Fed Length of feed: 14 min  LATCH Score/Interventions                      Lactation Tools Discussed/Used     Consult Status Consult Status: Follow-up Date: 11/04/13 Follow-up type: In-patient    Dahlia ByesBerkelhammer, Carmichael Burdette [facility] 11/04/2013, 4:47 PM

## 2013-11-04 NOTE — Progress Notes (Signed)
Patient ID: Holly Robles D Robles, female   DOB: April 04, 1981, 33 y.o.   MRN: [id number] Subjective: POD# 1 Information for the patient's newborn:  Holly Robles, Girl Holly Robles [[id number]][[id number]]  female   Reports feeling well Feeding: breast Patient reports tolerating PO.  Breast symptoms: none Pain controlled with ibuprofen (OTC) and narcotic analgesics including Percocet Denies HA/SOB/C/P/N/V/dizziness. Flatus present. She reports vaginal bleeding as normal, without clots.  She is ambulating, urinating without difficult.     Objective:   VS:  Filed Vitals:   11/03/13 1838 11/03/13 2230 11/04/13 0230 11/04/13 0630  BP: 108/64 100/62 88/50 111/66  Pulse: 98 100 86 80  Temp:  98.2 F (36.8 C) 98 F (36.7 C) 98 F (36.7 C)  TempSrc:  Axillary Oral Oral  Resp: 20 18 16 18   Height:      Weight:      SpO2: 99% 97% 96% 97%     Intake/Output Summary (Last 24 hours) at 11/04/13 1031 Last data filed at 11/03/13 2230  Gross per 24 hour  Intake    700 ml  Output   1650 ml  Net   -950 ml        Recent Labs  11/01/13 2000 11/04/13 0625  WBC 9.6 13.2*  HGB 12.2 9.6*  HCT 36.7 28.7*  PLT 181 135*     Blood type: A/Positive/-- (05/01 0000)  Rubella: Nonimmune (05/01 0000)     Physical Exam:  General: alert, cooperative and no distress Abdomen: soft, nontender Incision: Tegaderm and Honeycomb dressing intact - skin well approximated with staples Uterine Fundus: firm, below umbilicus, nontender Lochia: minimal Ext: extremities normal, atraumatic, no cyanosis, edema 1+ and Homans sign is negative, no sign of DVT      Assessment/Plan: 33 y.o.   POD# 1.  s/p Cesarean Delivery.  Indications: failure to progress                Principal Problem:   Postpartum care following cesarean delivery (1/5) Active Problems:   Post-dates pregnancy  Doing well, stable.               Regular diet as tolerated Ambulate Push po fluids Routine post-op care Desires early discharge tomorrow  Raelyn MoraDAWSON,  Yeshua Stryker, M, MSN, CNM 11/04/2013, 10:31 AM

## 2013-11-05 DIAGNOSIS — D62 Acute posthemorrhagic anemia: Secondary | ICD-10-CM | POA: Diagnosis not present

## 2013-11-05 MED ORDER — OXYCODONE-ACETAMINOPHEN 5-325 MG PO TABS: 1.0000 | ORAL_TABLET | ORAL | Status: DC | PRN

## 2013-11-05 MED ORDER — IBUPROFEN 600 MG PO TABS: 600.0000 mg | ORAL_TABLET | Freq: Four times a day (QID) | ORAL | Status: AC

## 2013-11-05 MED ORDER — POLYSACCHARIDE IRON COMPLEX 150 MG PO CAPS: 150.0000 mg | ORAL_CAPSULE | Freq: Every day | ORAL | Status: DC

## 2013-11-05 MED ORDER — POLYSACCHARIDE IRON COMPLEX 150 MG PO CAPS
150.0000 mg | ORAL_CAPSULE | Freq: Every day | ORAL | Status: DC
  Filled 2013-11-05: qty 1

## 2013-11-05 NOTE — Progress Notes (Signed)
POSTOPERATIVE DAY # 2 S/P CS   S:         Reports feeling well - ready to go home             Tolerating po intake / no nausea / no vomiting / + flatus / no BM             Bleeding is spotting             Pain controlled with motrin and pecocet             Up ad lib / ambulatory/ voiding QS  Newborn breast feeding    O:  VS: BP 96/50  Pulse 63  Temp(Src) 97.3 F (36.3 C) (Oral)  Resp 16  Ht 5\' 4"  (1.626 m)  Wt 81.647 kg (180 lb)  BMI 30.88 kg/m2  SpO2 95%  LMP 01/19/2013   LABS:               Recent Labs  11/04/13 0625  WBC 13.2*  HGB 9.6*  PLT 135*               Bloodtype: A/Positive/-- (05/01 0000)  Rubella: Nonimmune (05/01 0000)                                                     Physical Exam:             Alert and Oriented X3  Lungs: Clear and unlabored  Heart: regular rate and rhythm / no mumurs  Abdomen: soft, non-tender, non-distended, active BS             Fundus: firm, non-tender, U-1             Dressing intact honeycomb              Incision:  approximated with staples / no erythema / no ecchymosis / no drainage  Perineum: intact  Lochia: light  Extremities: trace edema, no calf pain or tenderness, negative Homans  A:        POD # 2 S/P CS            Anemia - ABL / stable  P:        Routine postoperative care              DC home             [facility] - [facility]   Marlinda MikeBAILEY, Blaire Palomino CNM, MSN, [facility] 11/05/2013, 9:06 AM

## 2013-11-05 NOTE — Discharge Summary (Signed)
POSTOPERATIVE DISCHARGE SUMMARY:  Patient ID: Holly Robles MRN: [id number] DOB/AGE: 12/22/1980 33 y.o.  Admit date: 11/01/2013 Admission Diagnoses: 41.1 weeks post-dates induction of labor  Discharge date:  11/05/2013 Discharge Diagnoses: POD 2 s/p cesarean section for arrest of active labor  Prenatal history: G1P1001   EDC : 10/26/2013, by Last Menstrual Period  Prenatal care at [facility]  Primary provider : Dr Cousins Prenatal course complicated by hx ASD - repaired / post-dates / depression / migraines  Prenatal Labs: ABO, Rh: A/Positive/-- (05/01 0000)  Antibody: Negative (05/01 0000) Rubella: Nonimmune (05/01 0000)  / MMR booster given prior to discharge RPR: Nonreactive (05/01 0000)  HBsAg: Negative (05/01 0000)  HIV: Non-reactive (05/01 0000)  GTT : NL GBS: Negative (11/25 0000)   Medical / Surgical History :  Past medical history:  Past Medical History  Diagnosis Date  . Depression   . Hyperemesis gravidarum   . Abnormal Pap smear   . Hx of female infertility   . Headache(784.0)     migraine  . ASD (atrial septal defect)     Past surgical history:  Past Surgical History  Procedure Laterality Date  . Asd repair    . Colposcopy    . Cesarean section N/A 11/03/2013    Procedure: CESAREAN SECTION;  Surgeon: Sheronette A Cousins, MD;  Location: [facility];  Service: Obstetrics;  Laterality: N/A;    Family History:  Family History  Problem Relation Age of Onset  . Hypothyroidism Mother   . Graves' disease Brother   . Hyperthyroidism Brother   . Hyperlipidemia Father   . Diabetes Maternal Uncle   . Cancer Maternal Grandmother     liver  . Heart disease Maternal Grandfather   . Heart attack Maternal Grandfather   . Cancer Paternal Grandmother   . Heart disease Paternal Grandfather   . Heart failure Paternal Grandfather   . Hypertension Brother   . Hyperlipidemia Brother     Social History:  reports that she has never smoked. She has  never used smokeless tobacco. She reports that she does not drink alcohol or use illicit drugs.  Allergies: Tetracyclines & related   Current Medications at time of admission:  Prior to Admission medications   Medication Sig Start Date End Date Taking? Authorizing Provider  acetaminophen (TYLENOL) 500 MG tablet Take 1,000 mg by mouth every 6 (six) hours as needed for moderate pain.   Yes Historical Provider, MD  buPROPion (WELLBUTRIN XL) 150 MG 24 hr tablet Take 150 mg by mouth daily.   Yes Historical Provider, MD  Doxylamine-Pyridoxine (DICLEGIS PO) Take 1 tablet by mouth daily.   Yes Historical Provider, MD  guaiFENesin (MUCINEX) 600 MG 12 hr tablet Take 600 mg by mouth 2 (two) times daily.   Yes Historical Provider, MD  OVER THE COUNTER MEDICATION Take 2 capsules by mouth daily. Blue cohosh   Yes Historical Provider, MD  Prenatal Vit-Fe Fumarate-FA (PRENATAL MULTIVITAMIN) TABS tablet Take 1 tablet by mouth daily at 12 noon.   Yes Historical Provider, MD    Intrapartum Course:  Admit for induction labor with labor progression to 6cm dilation with protracted labor curve Pain management: epidural Complicated by: arrest of labor Interventions required: cesarean section  Procedures: Cesarean section delivery on 11/03/2013 with delivery of female newborn by Dr Cousins   See operative report for further details APGAR (1 MIN): 9   APGAR (5 MINS): 9    Postoperative / postpartum course:  Uncomplicated with discharge on   POD 2 - mild ABL anemia/ stable   Physical Exam:   VSS: Temp:  [97.3 F (36.3 C)-98.2 F (36.8 C)] 97.3 F (36.3 C) (01/07 0627) Pulse Rate:  [63-83] 63 (01/07 0627) Resp:  [16-18] 16 (01/07 0627) BP: (96-110)/(50-64) 96/50 mmHg (01/07 0627) SpO2:  [95 %] 95 % (01/07 0627)  LABS:  Recent Labs  11/04/13 0625  WBC 13.2*  HGB 9.6*  PLT 135*    General: ambulatory / NAD or pain Heart: RRR Lungs: clear  Abdomen: soft and non-tender / non-distended / active BS   Extremities: trace  edema / negative Homans  Dressing: intact honeycomb dressing Incision:  approximated with staples / no erythema / no ecchymosis / no drainage  Discharge Instructions:  Discharged Condition: stable  Activity: pelvic rest x 6 weeks and postoperative restrictions x 2   Diet: routine  Medications:    Medication List    STOP taking these medications       DICLEGIS PO     OVER THE COUNTER MEDICATION      TAKE these medications       acetaminophen 500 MG tablet  Commonly known as:  TYLENOL  Take 1,000 mg by mouth every 6 (six) hours as needed for moderate pain.     buPROPion 150 MG 24 hr tablet  Commonly known as:  WELLBUTRIN XL  Take 150 mg by mouth daily.     guaiFENesin 600 MG 12 hr tablet  Commonly known as:  MUCINEX  Take 600 mg by mouth 2 (two) times daily.     ibuprofen 600 MG tablet  Commonly known as:  ADVIL,MOTRIN  Take 1 tablet (600 mg total) by mouth every 6 (six) hours.     iron polysaccharides 150 MG capsule  Commonly known as:  NIFEREX  Take 1 capsule (150 mg total) by mouth daily.     oxyCODONE-acetaminophen 5-325 MG per tablet  Commonly known as:  PERCOCET/ROXICET  Take 1-2 tablets by mouth every 4 (four) hours as needed for severe pain (moderate - severe pain).     prenatal multivitamin Tabs tablet  Take 1 tablet by mouth daily at 12 noon.        Wound Care: keep clean and dry / staples and dressing removal at [facility] Postpartum Instructions: [place] discharge booklet - instructions reviewed  Discharge to: Home  Follow up :  [facility] in 2-5 days for interval visit with midwife or nurse for staple removal [facility] in 6 weeks for routine postpartum visit with Dr Garwin Brothers                Signed: Artelia Laroche CNM, MSN, [facility] 11/05/2013, 9:16 AM

## 2014-05-12 ENCOUNTER — Encounter: Payer: Self-pay | Admitting: *Deleted

## 2014-08-14 ENCOUNTER — Other Ambulatory Visit: Payer: Self-pay

## 2014-08-31 ENCOUNTER — Encounter: Payer: Self-pay | Admitting: *Deleted

## 2016-10-09 ENCOUNTER — Ambulatory Visit
Admission: RE | Admit: 2016-10-09 | Discharge: 2016-10-09 | Disposition: A | Discharge status: Home / Self Care | Payer: BC Managed Care – PPO | Source: Ambulatory Visit | Attending: Internal Medicine | Admitting: Internal Medicine

## 2016-10-09 ENCOUNTER — Other Ambulatory Visit: Payer: Self-pay | Admitting: Internal Medicine

## 2016-10-09 DIAGNOSIS — R079 Chest pain, unspecified: Secondary | ICD-10-CM

## 2017-10-18 ENCOUNTER — Encounter: Payer: Self-pay | Admitting: Cardiology

## 2017-10-18 ENCOUNTER — Ambulatory Visit: Payer: BC Managed Care – PPO | Admitting: Cardiology

## 2017-10-18 VITALS — BP 100/70 | HR 72 | Ht 64.0 in | Wt 170.4 lb

## 2017-10-18 DIAGNOSIS — R002 Palpitations: Secondary | ICD-10-CM | POA: Diagnosis not present

## 2017-10-18 DIAGNOSIS — Z8774 Personal history of (corrected) congenital malformations of heart and circulatory system: Secondary | ICD-10-CM | POA: Diagnosis not present

## 2017-10-18 NOTE — Patient Instructions (Signed)
Medication Instructions:  The current medical regimen is effective;  continue present plan and medications.  Testing/Procedures: Your physician has requested that you have an echocardiogram. Echocardiography is a painless test that uses sound waves to create images of your heart. It provides your doctor with information about the size and shape of your heart and how well your heart's chambers and valves are working. This procedure takes approximately one hour. There are no restrictions for this procedure.  Follow-Up: Follow up as needed.  If you need a refill on your cardiac medications before your next appointment, please call your pharmacy.  Thank you for choosing [facility]!!      

## 2017-10-18 NOTE — Progress Notes (Signed)
Cardiology Office Note:    Date:  10/18/2017   ID:  Holly KirksMeredith D Wiersma, DOB 1981/03/15, MRN [id number]  PCP:  Georgann HousekeeperHusain, Karrar, MD  Cardiologist:  Donato SchultzMark Deazia Lampi, MD    Referring MD: Georgann HousekeeperHusain, Karrar, MD     History of Present Illness:    Holly Robles is a 36 y.o. female here for the evaluation of history of ASD repair as a child with intermittent palpitations at the request of Dr. Donette LarryHusain.  I had seen her several years ago.  At age 89 she [facility]  She was seeing Dr. Sherilyn CooterHenry at the time in pediatrics.  She remembers the situation, hospitalization quite well.  She has been having some palpitations intermittently, she can go through a week or so feeling her heart skipping, thawed-like sensation, no syncope.  Spontaneously resolved.  She does drink [employer]he[employer]Busch[employer]ountain Dew throughout the day.  Has had recent weight gain.  Has had costochondritis, chiropractor improved.  Brother has HTN, HL  ECHO done prior.   Past Medical History:  Diagnosis Date  . Abnormal Pap smear   . ASD (atrial septal defect)   . Depression   . Headache(784.0)    migraine  . Hx of female infertility   . Hyperemesis gravidarum     Past Surgical History:  Procedure Laterality Date  . ASD REPAIR    . CESAREAN SECTION N/A 11/03/2013   Procedure: CESAREAN SECTION;  Surgeon: Serita KyleSheronette A Cousins, MD;  Location: [facility];  Service: Obstetrics;  Laterality: N/A;  . COLPOSCOPY      Current Medications: Current Meds  Medication Sig  . acetaminophen (TYLENOL) 500 MG tablet Take 1,000 mg by mouth every 6 (six) hours as needed for moderate pain.  . citalopram (CELEXA) 20 MG tablet Take 20 mg by mouth daily.  Marland Kitchen. ibuprofen (ADVIL,MOTRIN) 600 MG tablet Take 1 tablet (600 mg total) by mouth every 6 (six) hours.  Marland Kitchen. loratadine (CLARITIN) 10 MG tablet Take 10 mg by mouth daily.  . Multiple Vitamin (MULTIVITAMIN) tablet Take 1 tablet by mouth daily.  . Probiotic Product (PROBIOTIC-10 PO) Take 1 tablet by mouth daily.      Allergies:   Sudafed [pseudoephedrine] and Tetracyclines & related   Social History   Socioeconomic History  . Marital status: Married    Spouse name: None  . Number of children: None  . Years of education: None  . Highest education level: None  Social Needs  . Financial resource strain: None  . Food insecurity - worry: None  . Food insecurity - inability: None  . Transportation needs - medical: None  . Transportation needs - non-medical: None  Occupational History  . None  Tobacco Use  . Smoking status: Never Smoker  . Smokeless tobacco: Never Used  Substance and Sexual Activity  . Alcohol use: No  . Drug use: No  . Sexual activity: Yes  Other Topics Concern  . None  Social History Narrative  . None     Family History: The patient's family history includes CAD in her father; Cancer in her paternal grandmother; Diabetes in her maternal uncle; Luiz BlareGraves' disease in her brother; Heart attack in her maternal grandfather; Heart disease in her maternal grandfather and paternal grandfather; Heart failure in her paternal grandfather; Hyperlipidemia in her brother and father; Hypertension in her brother; Hyperthyroidism in her brother; Hypothyroidism in her mother; Liver cancer in her maternal grandmother. ROS:   Please see the history of present illness.    Denies syncope  bleeding orthopnea PND.  Occasionally she will have dizziness, headaches, anxiety, depression, skipped beats.  All other systems reviewed and are negative.  EKGs/Labs/Other Studies Reviewed:    The following studies were reviewed today: Prior lab work reviewed, LDL 120s, prior office notes reviewed, prior EKG reviewed  EKG:  EKG is  ordered today.  The ekg ordered today demonstrates 10/18/17 shows sinus rhythm with nonspecific T wave abnormality, inversion noted in V2.  No new waves.  She remembered having U waves previously.  Prior EKG personally reviewed from 05/19/13 shows nonspecific T wave  flattening.  Recent Labs: No results found for requested labs within last 8760 hours.  Recent Lipid Panel No results found for: CHOL, TRIG, HDL, CHOLHDL, VLDL, LDLCALC, LDLDIRECT  Physical Exam:    VS:  BP 100/70   Pulse 72   Ht 5\' 4"  (1.626 m)   Wt 170 lb 6.4 oz (77.3 kg)   SpO2 98%   BMI 29.25 kg/m     Wt Readings from Last 3 Encounters:  10/18/17 170 lb 6.4 oz (77.3 kg)  11/01/13 180 lb (81.6 kg)     GEN:  Well nourished, well developed in no acute distress HEENT: Normal NECK: No JVD; No carotid bruits LYMPHATICS: No lymphadenopathy CARDIAC: RRR, no murmurs, rubs, gallops RESPIRATORY:  Clear to auscultation without rales, wheezing or rhonchi  ABDOMEN: Soft, non-tender, non-distended MUSCULOSKELETAL:  No edema; No deformity  SKIN: Warm and dry NEUROLOGIC:  Alert and oriented x 3 PSYCHIATRIC:  Normal affect   ASSESSMENT:    1. Palpitations   2. H/O congenital atrial septal defect (ASD) repair    PLAN:    In order of problems listed above:  Palpitations/ASD repair  -We will check an echocardiogram to ensure proper structure and function.  She does remember having trivial/mild valvular lesions in the past.  -Try to avoid excessive caffeine use, decrease [facility] usage.  -Encouraged daily exercise, continued approach to weight loss.  -I do not feel strongly that a monitor needs to be placed.  Her palpitations are quite classic and very similar.  She has worn monitors in the past which have been benign.  If symptoms change or become more worrisome, she will let me know   Medication Adjustments/Labs and Tests Ordered: Current medicines are reviewed at length with the patient today.  Concerns regarding medicines are outlined above.  Orders Placed This Encounter  Procedures  . EKG 12-Lead  . ECHOCARDIOGRAM COMPLETE   No orders of the defined types were placed in this encounter.   Signed, Donato SchultzMark Onesty Clair, MD  10/18/2017 11:06 AM    [facility]

## 2017-10-19 ENCOUNTER — Other Ambulatory Visit (HOSPITAL_COMMUNITY): Payer: BC Managed Care – PPO

## 2018-02-11 ENCOUNTER — Ambulatory Visit
Admission: RE | Admit: 2018-02-11 | Discharge: 2018-02-11 | Disposition: A | Discharge status: Home / Self Care | Payer: BC Managed Care – PPO | Source: Ambulatory Visit | Attending: Internal Medicine | Admitting: Internal Medicine

## 2018-02-11 ENCOUNTER — Other Ambulatory Visit: Payer: Self-pay | Admitting: Internal Medicine

## 2018-02-11 DIAGNOSIS — M79604 Pain in right leg: Secondary | ICD-10-CM

## 2018-10-26 ENCOUNTER — Encounter: Payer: Self-pay | Admitting: Gynecology

## 2018-10-26 ENCOUNTER — Ambulatory Visit
Admission: EM | Admit: 2018-10-26 | Discharge: 2018-10-26 | Disposition: A | Discharge status: Home / Self Care | Payer: BC Managed Care – PPO | Attending: Family Medicine | Admitting: Family Medicine

## 2018-10-26 ENCOUNTER — Other Ambulatory Visit: Payer: Self-pay

## 2018-10-26 DIAGNOSIS — J020 Streptococcal pharyngitis: Secondary | ICD-10-CM

## 2018-10-26 LAB — RAPID STREP SCREEN (MED CTR MEBANE ONLY): Streptococcus, Group A Screen (Direct): POSITIVE — AB

## 2018-10-26 MED ORDER — AMOXICILLIN 500 MG PO TABS: 500.0000 mg | ORAL_TABLET | Freq: Two times a day (BID) | ORAL | 0 refills | Status: DC

## 2018-10-26 NOTE — ED Triage Notes (Signed)
Patient c/o sore throat x 1 week. 

## 2018-10-26 NOTE — ED Provider Notes (Signed)
[facility]    CSN: [id number] Arrival date & time: 10/26/18  1306  History   Chief Complaint Sore throat  HPI  37 year old female presents with sore throat.  Patient reports 1 week history of sore throat, lymphadenopathy, ear pain.  No documented fever.  No known exacerbating or relieving factors.  No chills. Patient concerned that she has throat.  Sore throat is severe.  Associated fatigue.  No other associated symptoms.  No other complaints.  PMH, Surgical Hx, Family Hx, Social History reviewed and updated as below.  Past Medical History:  Diagnosis Date  . Abnormal Pap smear   . ASD (atrial septal defect)   . Depression   . Headache(784.0)    migraine  . Hx of female infertility   . Hyperemesis gravidarum     Patient Active Problem List   Diagnosis Date Noted  . Acute blood loss anemia 11/05/2013  . Postpartum care following cesarean delivery (1/5) 11/03/2013  . Post-dates pregnancy 11/01/2013    Past Surgical History:  Procedure Laterality Date  . ASD REPAIR    . CESAREAN SECTION N/A 11/03/2013   Procedure: CESAREAN SECTION;  Surgeon: Serita KyleSheronette A Cousins, MD;  Location: [facility];  Service: Obstetrics;  Laterality: N/A;  . COLPOSCOPY      OB History    Gravida  1   Para  1   Term  1   Preterm      AB      Living  1     SAB      TAB      Ectopic      Multiple      Live Births  1            Home Medications    Prior to Admission medications   Medication Sig Start Date End Date Taking? Authorizing Provider  acetaminophen (TYLENOL) 500 MG tablet Take 1,000 mg by mouth every 6 (six) hours as needed for moderate pain.   Yes [provider]  citalopram (CELEXA) 20 MG tablet Take 20 mg by mouth daily. 10/09/17  Yes [provider]  ibuprofen (ADVIL,MOTRIN) 600 MG tablet Take 1 tablet (600 mg total) by mouth every 6 (six) hours. 11/05/13  Yes Marlinda MikeBailey, Tanya, CNM  Multiple Vitamin (MULTIVITAMIN) tablet Take 1 tablet  by mouth daily.   Yes [provider]  amoxicillin (AMOXIL) 500 MG tablet Take 1 tablet (500 mg total) by mouth 2 (two) times daily. 10/26/18   Tommie Samsook, Rondy Krupinski G, DO    Family History Family History  Problem Relation Age of Onset  . Heart disease Maternal Grandfather   . Heart attack Maternal Grandfather   . Heart disease Paternal Grandfather   . Heart failure Paternal Grandfather   . Hypothyroidism Mother   . Graves' disease Brother   . Hyperthyroidism Brother   . Hyperlipidemia Father   . CAD Father   . Diabetes Maternal Uncle   . Liver cancer Maternal Grandmother   . Cancer Paternal Grandmother   . Hypertension Brother   . Hyperlipidemia Brother     Social History Social History   Tobacco Use  . Smoking status: Never Smoker  . Smokeless tobacco: Never Used  Substance Use Topics  . Alcohol use: No  . Drug use: No     Allergies   Sudafed [pseudoephedrine] and Tetracyclines & related   Review of Systems Review of Systems  Constitutional: Positive for fatigue. Negative for fever.  HENT: Positive for sore throat.  Hematological: Positive for adenopathy.   Physical Exam Triage Vital Signs ED Triage Vitals  Enc Vitals Group     BP 10/26/18 1358 104/65     Pulse Rate 10/26/18 1358 77     Resp 10/26/18 1358 16     Temp 10/26/18 1358 98.7 F (37.1 C)     Temp Source 10/26/18 1358 Oral     SpO2 10/26/18 1358 100 %     Weight 10/26/18 1357 170 lb (77.1 kg)     Height 10/26/18 1357 5\' 4"  (1.626 m)     Head Circumference --      Peak Flow --      Pain Score 10/26/18 1357 2     Pain Loc --      Pain Edu? --      Excl. in GC? --    Updated Vital Signs BP 104/65 (BP Location: Left Arm)   Pulse 77   Temp 98.7 F (37.1 C) (Oral)   Resp 16   Ht 5\' 4"  (1.626 m)   Wt 77.1 kg   LMP 10/12/2018   SpO2 100%   BMI 29.18 kg/m   Visual Acuity Right Eye Distance:   Left Eye Distance:   Bilateral Distance:    Right Eye Near:   Left Eye Near:    Bilateral  Near:     Physical Exam Vitals signs and nursing note reviewed.  Constitutional:      Appearance: Normal appearance.  HENT:     Head: Normocephalic and atraumatic.     Right Ear: Tympanic membrane normal.     Left Ear: Tympanic membrane normal.     Mouth/Throat:     Comments: Oropharynx with moderate erythema.  No exudate. Neck:     Musculoskeletal: Neck supple.     Comments: Anterior cervical tenderness.  No appreciable lymphadenopathy. Cardiovascular:     Rate and Rhythm: Normal rate and regular rhythm.  Pulmonary:     Effort: Pulmonary effort is normal. No respiratory distress.  Neurological:     Mental Status: She is alert.  Psychiatric:        Mood and Affect: Mood normal.        Behavior: Behavior normal.    UC Treatments / Results  Labs (all labs ordered are listed, but only abnormal results are displayed) Labs Reviewed  RAPID STREP SCREEN ([facility] ONLY) - Abnormal; Notable for the following components:      Result Value   Streptococcus, Group A Screen (Direct) POSITIVE (*)    All other components within normal limits    EKG None  Radiology No results found.  Procedures Procedures (including critical care time)  Medications Ordered in UC Medications - No data to display  Initial Impression / Assessment and Plan / UC Course  I have reviewed the triage vital signs and the nursing notes.  Pertinent labs & imaging results that were available during my care of the patient were reviewed by me and considered in my medical decision making (see chart for details).    38 year old female presents with strep pharyngitis.  Treating with amoxicillin.  Final Clinical Impressions(s) / UC Diagnoses   Final diagnoses:  Strep pharyngitis   Discharge Instructions   None    ED Prescriptions    Medication Sig Dispense Auth. Provider   amoxicillin (AMOXIL) 500 MG tablet Take 1 tablet (500 mg total) by mouth 2 (two) times daily. 20 tablet Everlene Other G, DO       Controlled Substance  Prescriptions  Controlled Substance Registry consulted? Not Applicable   Tommie SamsCook, Ignacia Gentzler G, DO 10/26/18 1504

## 2020-02-10 ENCOUNTER — Other Ambulatory Visit: Payer: Self-pay | Admitting: Obstetrics and Gynecology

## 2020-02-10 DIAGNOSIS — R1902 Left upper quadrant abdominal swelling, mass and lump: Secondary | ICD-10-CM

## 2020-02-13 ENCOUNTER — Ambulatory Visit
Admission: RE | Admit: 2020-02-13 | Discharge: 2020-02-13 | Disposition: A | Discharge status: Home / Self Care | Payer: BC Managed Care – PPO | Source: Ambulatory Visit | Attending: Obstetrics and Gynecology | Admitting: Obstetrics and Gynecology

## 2020-02-13 DIAGNOSIS — R1902 Left upper quadrant abdominal swelling, mass and lump: Secondary | ICD-10-CM

## 2020-05-27 ENCOUNTER — Ambulatory Visit (INDEPENDENT_AMBULATORY_CARE_PROVIDER_SITE_OTHER): Payer: BC Managed Care – PPO

## 2020-05-27 ENCOUNTER — Other Ambulatory Visit: Payer: Self-pay

## 2020-05-27 ENCOUNTER — Ambulatory Visit
Admission: RE | Admit: 2020-05-27 | Discharge: 2020-05-27 | Disposition: A | Discharge status: Home / Self Care | Payer: BC Managed Care – PPO | Source: Ambulatory Visit | Attending: Family Medicine | Admitting: Family Medicine

## 2020-05-27 VITALS — BP 98/65 | HR 75 | Temp 98.5°F | Resp 18 | Ht 64.0 in | Wt 170.0 lb

## 2020-05-27 DIAGNOSIS — S60221A Contusion of right hand, initial encounter: Secondary | ICD-10-CM

## 2020-05-27 DIAGNOSIS — L03119 Cellulitis of unspecified part of limb: Secondary | ICD-10-CM

## 2020-05-27 DIAGNOSIS — L259 Unspecified contact dermatitis, unspecified cause: Secondary | ICD-10-CM

## 2020-05-27 MED ORDER — CEPHALEXIN 500 MG PO CAPS: 500.0000 mg | ORAL_CAPSULE | Freq: Three times a day (TID) | ORAL | 0 refills | Status: AC

## 2020-05-27 MED ORDER — PREDNISONE 10 MG PO TABS: ORAL_TABLET | ORAL | 0 refills | Status: DC

## 2020-05-27 MED ORDER — MUPIROCIN CALCIUM 2 % EX CREA: 1.0000 "application " | TOPICAL_CREAM | Freq: Two times a day (BID) | CUTANEOUS | 0 refills | Status: DC

## 2020-05-27 NOTE — ED Triage Notes (Signed)
Pt c/o right hand pain. She states she stuck a piece of bamboo into her skin about 6 days ago. She also has a rash area the area from the band-aid sheshe was using.

## 2020-05-27 NOTE — ED Provider Notes (Addendum)
[facility] ____________________________________________  Time seen: Approximately 11:29 AM  I have reviewed the triage vital signs and the nursing notes.   HISTORY  Chief Complaint Hand Injury (right)   HPI Holly Robles is a 39 y.o. female presenting for evaluation of right hand pain after injury.  Patient reports this past Friday she was using a machete to cut down weeds.  States in this process she accidentally hit her hand against a sharp edge of a thick weed she had already cut.  States it did break the skin and a piece went inside.  States she did remove 2 pieces out, states possible other piece still present.  Reports she has had gradual onset of swelling and some discomfort.  Denies any paresthesias, pain radiation, decreased strength.  States continues remain active with hand and feel strong.  States she did also have a Band-Aid over the area and reports she is now started to have a rash surrounding it that is very itchy.  Denies drainage.  Denies fevers.  Reports otherwise doing well.  Has been applying topical creams without resolution.  Reports tetanus immunization is up-to-date.  Georgann Housekeeper, MD : PCP Patient's last menstrual period was 05/13/2020 (approximate). Denies pregnancy.    Past Medical History:  Diagnosis Date  . Abnormal Pap smear   . ASD (atrial septal defect)   . Depression   . Headache(784.0)    migraine  . Hx of female infertility   . Hyperemesis gravidarum     Patient Active Problem List   Diagnosis Date Noted  . Acute blood loss anemia 11/05/2013  . Postpartum care following cesarean delivery (1/5) 11/03/2013  . Post-dates pregnancy 11/01/2013    Past Surgical History:  Procedure Laterality Date  . ASD REPAIR    . CESAREAN SECTION N/A 11/03/2013   Procedure: CESAREAN SECTION;  Surgeon: Serita Kyle, MD;  Location: [facility];  Service: Obstetrics;  Laterality: N/A;  . COLPOSCOPY       No current facility-administered  medications for this encounter.  Current Outpatient Medications:  .  citalopram (CELEXA) 20 MG tablet, Take 20 mg by mouth daily., Disp: , Rfl:  .  Multiple Vitamin (MULTIVITAMIN) tablet, Take 1 tablet by mouth daily., Disp: , Rfl:  .  acetaminophen (TYLENOL) 500 MG tablet, Take 1,000 mg by mouth every 6 (six) hours as needed for moderate pain., Disp: , Rfl:  .  amoxicillin (AMOXIL) 500 MG tablet, Take 1 tablet (500 mg total) by mouth 2 (two) times daily., Disp: 20 tablet, Rfl: 0 .  cephALEXin (KEFLEX) 500 MG capsule, Take 1 capsule (500 mg total) by mouth 3 (three) times daily for 10 days., Disp: 30 capsule, Rfl: 0 .  folic acid (FOLVITE) 1 MG tablet, Take 1 mg by mouth daily., Disp: , Rfl:  .  ibuprofen (ADVIL,MOTRIN) 600 MG tablet, Take 1 tablet (600 mg total) by mouth every 6 (six) hours., Disp: 30 tablet, Rfl: 0 .  mupirocin cream (BACTROBAN) 2 %, Apply 1 application topically 2 (two) times daily., Disp: 15 g, Rfl: 0 .  predniSONE (DELTASONE) 10 MG tablet, Start 60 mg po day one, then 50 mg po day two, taper by 10 mg daily until complete., Disp: 21 tablet, Rfl: 0  Allergies Sudafed [pseudoephedrine] and Tetracyclines & related  Family History  Problem Relation Age of Onset  . Heart disease Maternal Grandfather   . Heart attack Maternal Grandfather   . Heart disease Paternal Grandfather   . Heart failure Paternal Grandfather   .  Hypothyroidism Mother   . Graves' disease Brother   . Hyperthyroidism Brother   . Hyperlipidemia Father   . CAD Father   . Diabetes Maternal Uncle   . Liver cancer Maternal Grandmother   . Cancer Paternal Grandmother   . Hypertension Brother   . Hyperlipidemia Brother     Social History Social History   Tobacco Use  . Smoking status: Never Smoker  . Smokeless tobacco: Never Used  Vaping Use  . Vaping Use: Never used  Substance Use Topics  . Alcohol use: No  . Drug use: No    Review of Systems Constitutional: No fever Cardiovascular: Denies  chest pain. Respiratory: Denies shortness of breath. Musculoskeletal: Left hand pain. Skin: Positive rash. ____________________________________________   PHYSICAL EXAM:  VITAL SIGNS: ED Triage Vitals  Enc Vitals Group     BP 05/27/20 1120 98/65     Pulse Rate 05/27/20 1120 75     Resp 05/27/20 1120 18     Temp 05/27/20 1120 98.5 F (36.9 C)     Temp Source 05/27/20 1120 Oral     SpO2 05/27/20 1120 100 %     Weight 05/27/20 1117 169 lb 15.6 oz (77.1 kg)     Height 05/27/20 1117 5\' 4"  (1.626 m)     Head Circumference --      Peak Flow --      Pain Score 05/27/20 1117 1     Pain Loc --      Pain Edu? --      Excl. in GC? --     Constitutional: Alert and oriented. Well appearing and in no acute distress. Eyes: Conjunctivae are normal.  ENT      Head: Normocephalic and atraumatic. Cardiovascular: Good peripheral circulation. Respiratory: Normal respiratory effort without tachypnea nor retractions.  Musculoskeletal:  Steady gait.  Except: Right hand along third MCP joint healing abrasion with mild surrounding erythema, tenderness to palpation mildly to base of second third and fourth phalanx, also with diffuse mildly erythematous vesicular papular rash to dorsal hand that is pruritic without further surrounding erythema or drainage.  Right hand good distal sensation capillary refill, strong distal flexion and extension to all fingers. Neurologic:  Normal speech and language.  Speech is normal. No gait instability.  Skin:  Skin is warm, dry and intact. No rash noted. Psychiatric: Mood and affect are normal. Speech and behavior are normal. Patient exhibits appropriate insight and judgment   ___________________________________________   LABS (all labs ordered are listed, but only abnormal results are displayed)  Labs Reviewed - No data to display ____________________________________________  RADIOLOGY  DG Hand Complete Right  Result Date: 05/27/2020 CLINICAL DATA:  Pain  after sticking piece of bamboo into hand EXAM: RIGHT HAND - COMPLETE 3+ VIEW COMPARISON:  None. FINDINGS: Frontal, oblique, and lateral views were obtained. No fracture or dislocation. Joint spaces appear normal. No erosive change or bony destruction. No radiopaque foreign body evident. IMPRESSION: No fracture or dislocation. No bony destruction. No appreciable arthropathy. No radiopaque foreign body evident. Electronically Signed   By: 05/29/2020 III M.D.   On: 05/27/2020 11:42   ____________________________________________   PROCEDURES Procedures    INITIAL IMPRESSION / ASSESSMENT AND PLAN / ED COURSE  Pertinent labs & imaging results that were available during my care of the patient were reviewed by me and considered in my medical decision making (see chart for details).  Well-appearing patient.  No acute distress.  Right hand injury as above.  Suspect secondary  cellulitis as well as a contact dermatitis.  Discussed with patient potential for retained foreign body, patient verbalized understanding, as no foreign body palpated on exam and none seen on x-ray, encourage supportive care patient agrees to this and expresses does not want exploration to wound.  Follow-up with Ortho for any continued complaints.  X-ray as above.  Will treat with cephalexin, prednisone and topical Bactroban.  Keep planed supportive care.Discussed indication, risks and benefits of medications with patient.   Discussed follow up with Primary care physician this week. Discussed follow up and return parameters including no resolution or any worsening concerns. Patient verbalized understanding and agreed to plan.   ____________________________________________   FINAL CLINICAL IMPRESSION(S) / ED DIAGNOSES  Final diagnoses:  Contusion of right hand, initial encounter  Cellulitis of hand  Contact dermatitis, unspecified contact dermatitis type, unspecified trigger     ED Discharge Orders         Ordered     cephALEXin (KEFLEX) 500 MG capsule  3 times daily     Discontinue  Reprint     05/27/20 1150    predniSONE (DELTASONE) 10 MG tablet     Discontinue  Reprint     05/27/20 1150    mupirocin cream (BACTROBAN) 2 %  2 times daily     Discontinue  Reprint     05/27/20 1150           Note: This dictation was prepared with Dragon dictation along with smaller phrase technology. Any transcriptional errors that result from this process are unintentional.     Renford Dills, NP 05/27/20 1232

## 2020-05-27 NOTE — Discharge Instructions (Addendum)
Take medication as prescribed. Rest. Keep clean. Monitor.   As discussed, for any continued discomfort or concerns, follow-up with orthopedic.  See above.  Follow up with your primary care physician this week as needed. Return to Urgent care for new or worsening concerns.

## 2020-07-06 ENCOUNTER — Other Ambulatory Visit: Payer: Self-pay

## 2020-09-20 ENCOUNTER — Other Ambulatory Visit: Payer: BC Managed Care – PPO

## 2020-09-20 DIAGNOSIS — Z20822 Contact with and (suspected) exposure to covid-19: Secondary | ICD-10-CM

## 2020-09-21 LAB — SARS-COV-2, NAA 2 DAY TAT

## 2020-09-21 LAB — NOVEL CORONAVIRUS, NAA: SARS-CoV-2, NAA: NOT DETECTED

## 2020-09-27 ENCOUNTER — Other Ambulatory Visit: Payer: BC Managed Care – PPO

## 2020-09-27 DIAGNOSIS — Z20822 Contact with and (suspected) exposure to covid-19: Secondary | ICD-10-CM

## 2020-09-28 LAB — NOVEL CORONAVIRUS, NAA: SARS-CoV-2, NAA: NOT DETECTED

## 2020-09-28 LAB — SARS-COV-2, NAA 2 DAY TAT

## 2020-10-04 ENCOUNTER — Other Ambulatory Visit: Payer: Self-pay

## 2020-10-04 ENCOUNTER — Other Ambulatory Visit: Payer: BC Managed Care – PPO

## 2020-10-04 DIAGNOSIS — Z20822 Contact with and (suspected) exposure to covid-19: Secondary | ICD-10-CM

## 2020-10-05 LAB — NOVEL CORONAVIRUS, NAA: SARS-CoV-2, NAA: NOT DETECTED

## 2020-10-05 LAB — SARS-COV-2, NAA 2 DAY TAT

## 2020-10-11 ENCOUNTER — Other Ambulatory Visit: Payer: BC Managed Care – PPO

## 2020-10-14 ENCOUNTER — Other Ambulatory Visit: Payer: BC Managed Care – PPO

## 2020-10-14 DIAGNOSIS — Z20822 Contact with and (suspected) exposure to covid-19: Secondary | ICD-10-CM

## 2020-10-15 LAB — NOVEL CORONAVIRUS, NAA: SARS-CoV-2, NAA: NOT DETECTED

## 2020-10-15 LAB — SARS-COV-2, NAA 2 DAY TAT

## 2020-10-18 ENCOUNTER — Other Ambulatory Visit: Payer: BC Managed Care – PPO

## 2020-10-18 DIAGNOSIS — Z20822 Contact with and (suspected) exposure to covid-19: Secondary | ICD-10-CM

## 2020-10-20 LAB — NOVEL CORONAVIRUS, NAA: SARS-CoV-2, NAA: NOT DETECTED

## 2020-10-20 LAB — SARS-COV-2, NAA 2 DAY TAT

## 2020-11-01 ENCOUNTER — Other Ambulatory Visit: Payer: BC Managed Care – PPO

## 2020-11-02 ENCOUNTER — Other Ambulatory Visit: Payer: Self-pay

## 2020-11-06 ENCOUNTER — Other Ambulatory Visit: Payer: Self-pay

## 2020-11-06 DIAGNOSIS — Z20822 Contact with and (suspected) exposure to covid-19: Secondary | ICD-10-CM

## 2020-11-08 ENCOUNTER — Other Ambulatory Visit: Payer: Self-pay

## 2020-11-08 ENCOUNTER — Other Ambulatory Visit: Payer: BC Managed Care – PPO

## 2020-11-08 DIAGNOSIS — Z20822 Contact with and (suspected) exposure to covid-19: Secondary | ICD-10-CM

## 2020-11-08 LAB — NOVEL CORONAVIRUS, NAA: SARS-CoV-2, NAA: DETECTED — AB

## 2020-11-08 LAB — SARS-COV-2, NAA 2 DAY TAT

## 2020-11-10 LAB — NOVEL CORONAVIRUS, NAA: SARS-CoV-2, NAA: NOT DETECTED

## 2020-11-10 LAB — SARS-COV-2, NAA 2 DAY TAT

## 2020-11-16 ENCOUNTER — Other Ambulatory Visit: Payer: BC Managed Care – PPO

## 2020-11-22 ENCOUNTER — Other Ambulatory Visit: Payer: BC Managed Care – PPO

## 2020-11-29 ENCOUNTER — Other Ambulatory Visit: Payer: BC Managed Care – PPO

## 2020-12-06 ENCOUNTER — Other Ambulatory Visit: Payer: BC Managed Care – PPO

## 2020-12-13 ENCOUNTER — Other Ambulatory Visit: Payer: BC Managed Care – PPO

## 2020-12-20 ENCOUNTER — Other Ambulatory Visit: Payer: BC Managed Care – PPO

## 2020-12-27 ENCOUNTER — Other Ambulatory Visit: Payer: BC Managed Care – PPO

## 2021-09-07 IMAGING — US US ABDOMEN COMPLETE
2 series · 13 of 25 positions shown · non-contrast
Comparison: None available

CLINICAL DATA: Left upper quadrant superficial mass. Hand and foot
swelling.

EXAM:
COMPLETE ABDOMINAL ULTRASOUND

[Series 1: us abdomen complete · 0.20mm/px · 12 of 87 slices shown (1 of 2)]
[im 1/87]
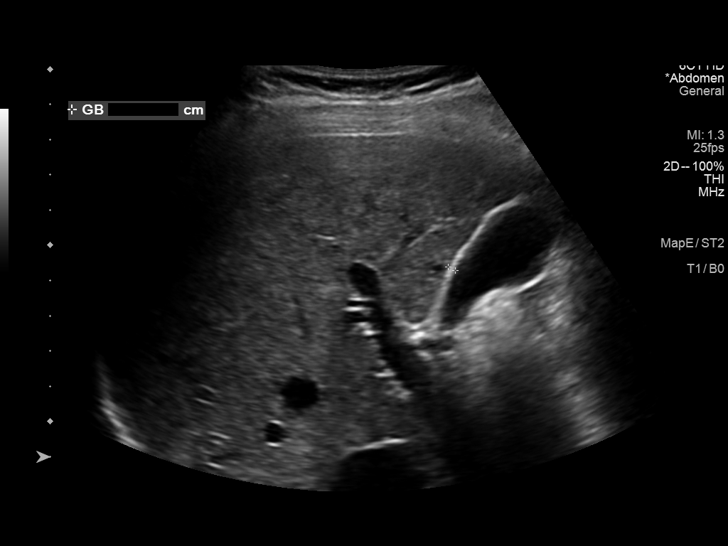
[im 8/87]
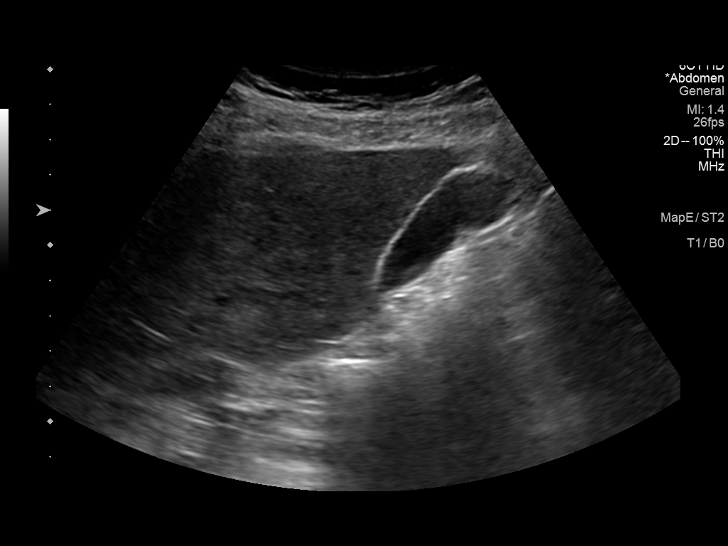
[im 16/87]
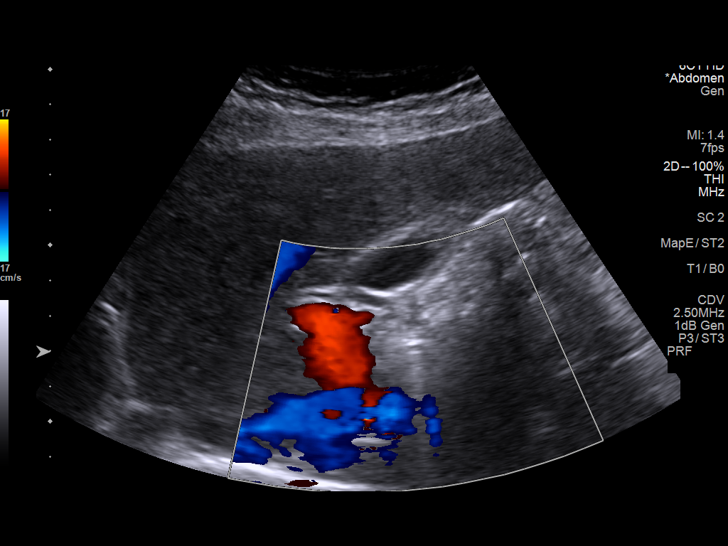
[im 24/87]
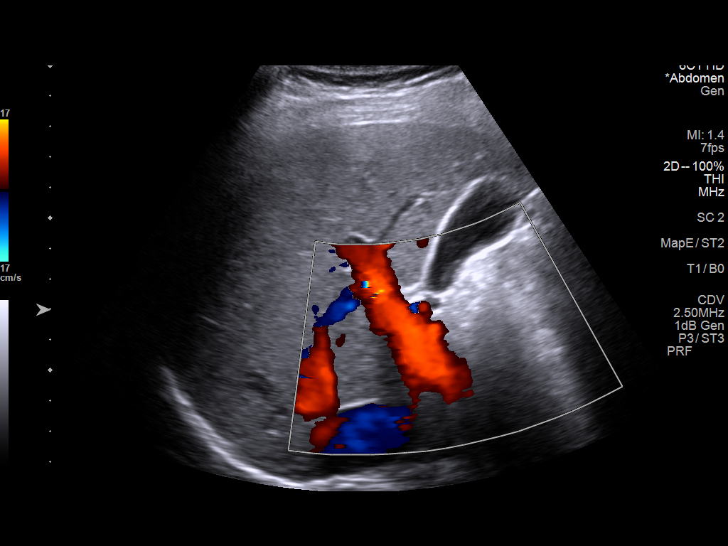
[im 32/87]
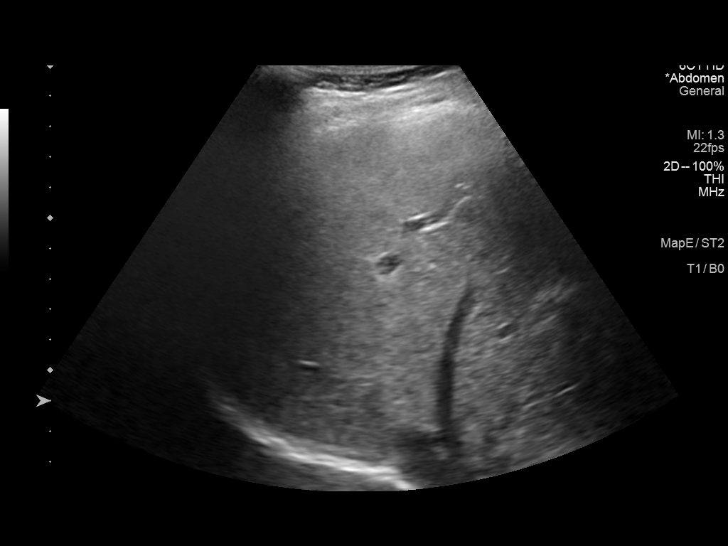
[im 40/87]
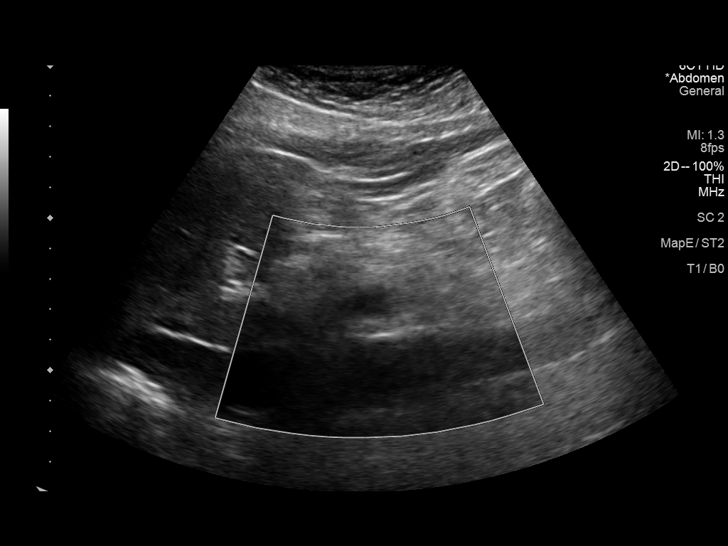
[im 47/87]
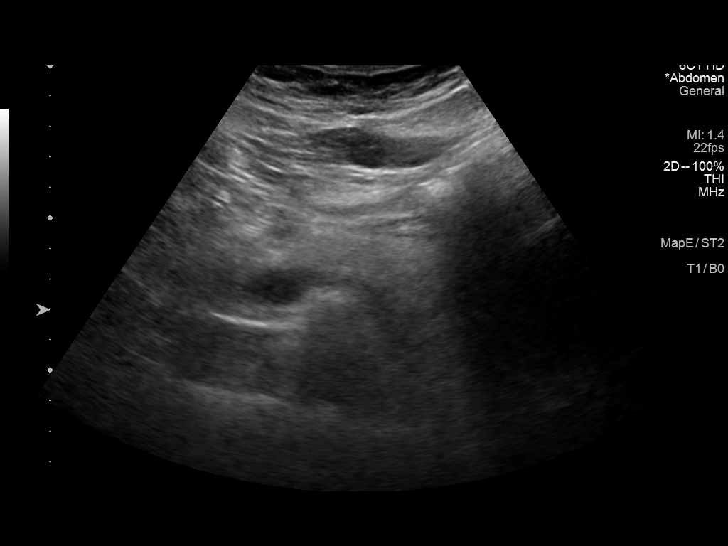
[im 55/87]
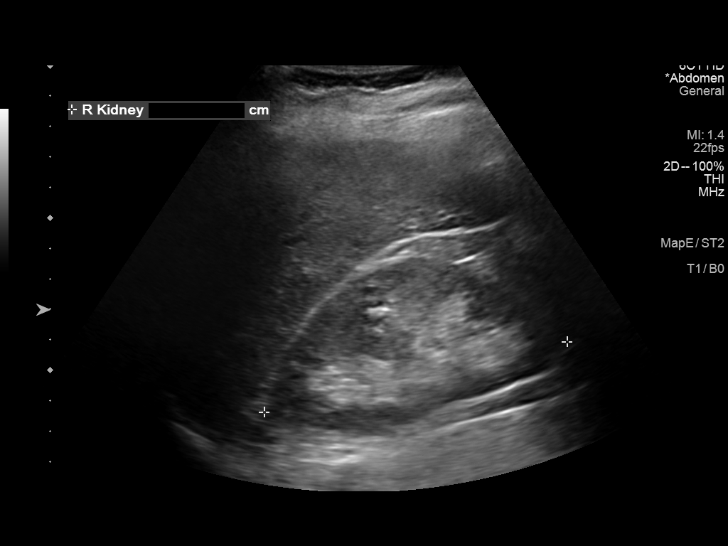
[im 63/87]
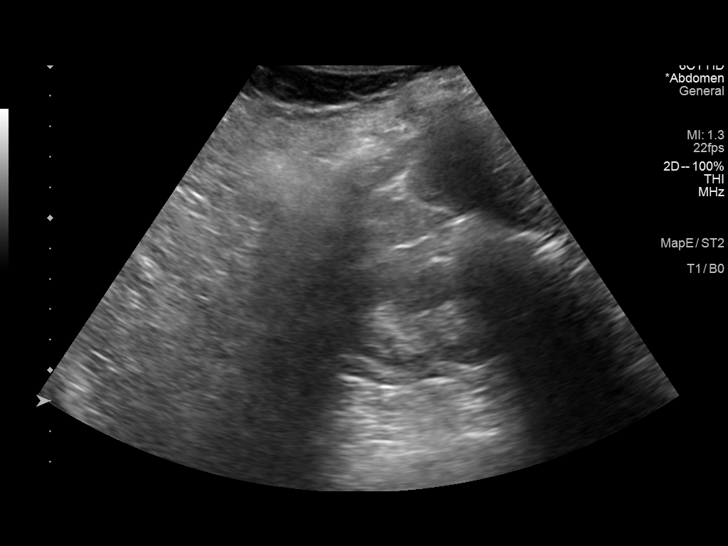
[im 71/87]
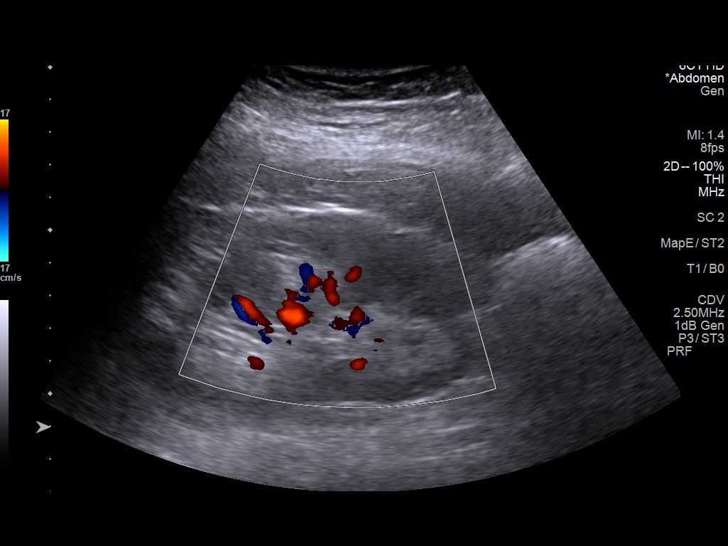
[im 79/87]
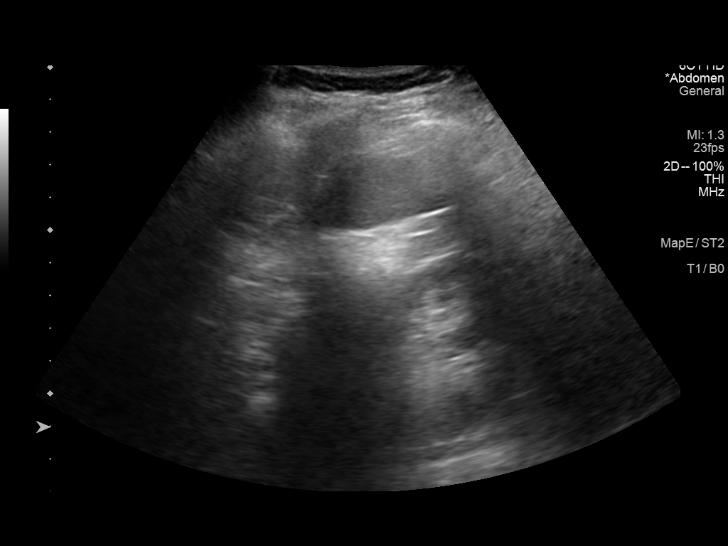
[im 87/87]
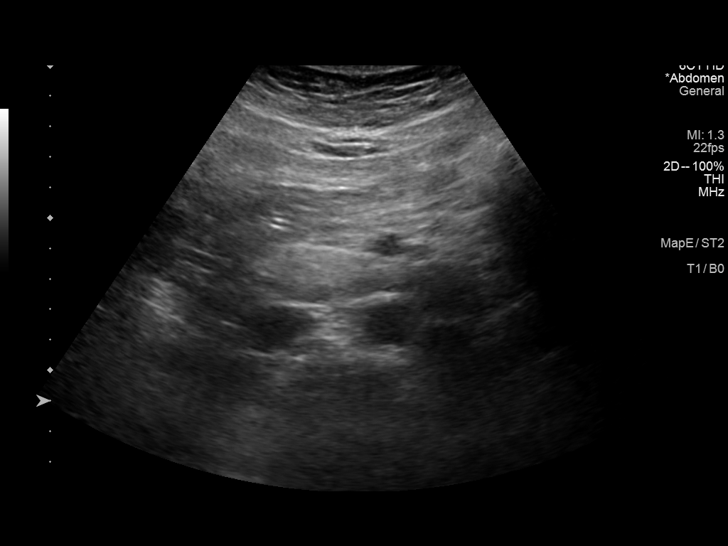

[Series 2001: us abdomen complete · 0.06mm/px · 1 of 9 slices shown (2 of 2)]
[im 9/9]
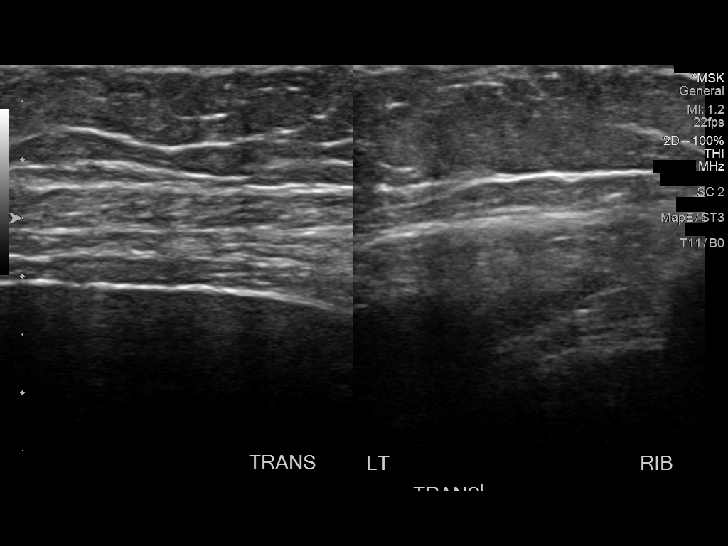

[13 of 25 positions shown; findings below may reference images not displayed]

FINDINGS: Gallbladder: Physiologically distended without stones, wall
thickening, or pericholecystic fluid. Sonographer reports no
sonographic Murphy's sign.

Common bile duct:  Normal in caliber, 2.4mm diameter.

Liver: Homogeneous in echotexture without focal lesion or
intrahepatic bile duct dilatation. Antegrade color flow signal in
the main portal vein.

IVC:  Negative

Pancreas: Visualized segments unremarkable, portions obscured by
overlying bowel gas.

Spleen:  Craniocaudal 9.4cm in length.  Scattered small granulomas.

Right Kidney:  No mass or hydronephrosis, 10.2cm in length.

Left Kidney:  No lesion or hydronephrosis, 10.4cm in length.

Abdominal aorta:  Negative

Directed scanning in the region of concern anterior to the left
lower ribs shows no mass, cyst, abscess, aneurysm, adenopathy, or
other pathologic findings. Limited contralateral images are
similarly unremarkable.
IMPRESSION: Negative

## 2021-12-08 ENCOUNTER — Encounter: Payer: Self-pay | Admitting: Emergency Medicine

## 2021-12-08 ENCOUNTER — Ambulatory Visit: Admit: 2021-12-08 | Payer: Self-pay

## 2021-12-08 ENCOUNTER — Ambulatory Visit
Admission: EM | Admit: 2021-12-08 | Discharge: 2021-12-08 | Disposition: A | Discharge status: Home / Self Care | Payer: BC Managed Care – PPO

## 2021-12-08 ENCOUNTER — Other Ambulatory Visit: Payer: Self-pay

## 2021-12-08 DIAGNOSIS — R509 Fever, unspecified: Secondary | ICD-10-CM | POA: Diagnosis not present

## 2021-12-08 DIAGNOSIS — J029 Acute pharyngitis, unspecified: Secondary | ICD-10-CM

## 2021-12-08 MED ORDER — AMOXICILLIN 500 MG PO CAPS: 500.0000 mg | ORAL_CAPSULE | Freq: Two times a day (BID) | ORAL | 0 refills | Status: AC

## 2021-12-08 NOTE — ED Triage Notes (Addendum)
Pt c/o nasal congestion, body aches, sweats, chills, right ear pain, sore throat. Started about 5 days ago. She states her throat hurts but does not feel like strep.

## 2021-12-08 NOTE — ED Provider Notes (Signed)
[facility]    CSN: [id number] Arrival date & time: 12/08/21  8280      History   Chief Complaint Chief Complaint  Patient presents with   Nasal Congestion   Sore Throat    HPI Holly Robles is a 41 y.o. female.   HPI  Cold Symptoms: Patient reports that they have had symptoms of nasal congestion, sore throat for the past 5 days. Symptoms are stable. They deny SOB, chest pain, fever or vomiting. They have tried dayquil for symptoms. No known sick contacts.    Past Medical History:  Diagnosis Date   Abnormal Pap smear    ASD (atrial septal defect)    Depression    Headache(784.0)    migraine   Hx of female infertility    Hyperemesis gravidarum     Patient Active Problem List   Diagnosis Date Noted   Acute blood loss anemia 11/05/2013   Postpartum care following cesarean delivery (1/5) 11/03/2013   Post-dates pregnancy 11/01/2013    Past Surgical History:  Procedure Laterality Date   ASD REPAIR     CESAREAN SECTION N/A 11/03/2013   Procedure: CESAREAN SECTION;  Surgeon: Serita Kyle, MD;  Location: [facility];  Service: Obstetrics;  Laterality: N/A;   COLPOSCOPY      OB History     Gravida  1   Para  1   Term  1   Preterm  0   AB  0   Living  1      SAB  0   IAB  0   Ectopic  0   Multiple      Live Births  1            Home Medications    Prior to Admission medications   Medication Sig Start Date End Date Taking? Authorizing Provider  acetaminophen (TYLENOL) 500 MG tablet Take 1,000 mg by mouth every 6 (six) hours as needed for moderate pain.   Yes [provider]  escitalopram (LEXAPRO) 20 MG tablet 1 tablet 10/06/20  Yes [provider]  amoxicillin (AMOXIL) 500 MG tablet Take 1 tablet (500 mg total) by mouth 2 (two) times daily. 10/26/18   Tommie Sams, DO  citalopram (CELEXA) 20 MG tablet Take 20 mg by mouth daily. 10/09/17   [provider]  folic acid (FOLVITE) 1 MG tablet Take 1  mg by mouth daily. 05/26/20   [provider]  ibuprofen (ADVIL,MOTRIN) 600 MG tablet Take 1 tablet (600 mg total) by mouth every 6 (six) hours. 11/05/13   Marlinda Mike, CNM  Multiple Vitamin (MULTIVITAMIN) tablet Take 1 tablet by mouth daily.    [provider]  mupirocin cream (BACTROBAN) 2 % Apply 1 application topically 2 (two) times daily. 05/27/20   Renford Dills, NP  predniSONE (DELTASONE) 10 MG tablet Start 60 mg po day one, then 50 mg po day two, taper by 10 mg daily until complete. 05/27/20   Renford Dills, NP    Family History Family History  Problem Relation Age of Onset   Heart disease Maternal Grandfather    Heart attack Maternal Grandfather    Heart disease Paternal Grandfather    Heart failure Paternal Grandfather    Hypothyroidism Mother    Luiz Blare' disease Brother    Hyperthyroidism Brother    Hyperlipidemia Father    CAD Father    Diabetes Maternal Uncle    Liver cancer Maternal Grandmother    Cancer Paternal Grandmother  Hypertension Brother    Hyperlipidemia Brother     Social History Social History   Tobacco Use   Smoking status: Never   Smokeless tobacco: Never  Vaping Use   Vaping Use: Never used  Substance Use Topics   Alcohol use: No   Drug use: No     Allergies   Sudafed [pseudoephedrine] and Tetracyclines & related   Review of Systems Review of Systems  As stated above in HPI Physical Exam Triage Vital Signs ED Triage Vitals  Enc Vitals Group     BP 12/08/21 0836 117/74     Pulse Rate 12/08/21 0836 78     Resp 12/08/21 0836 18     Temp 12/08/21 0836 99.5 F (37.5 C)     Temp Source 12/08/21 0836 Oral     SpO2 12/08/21 0836 100 %     Weight 12/08/21 0833 169 lb 15.6 oz (77.1 kg)     Height 12/08/21 0833 5\' 4"  (1.626 m)     Head Circumference --      Peak Flow --      Pain Score 12/08/21 0833 2     Pain Loc --      Pain Edu? --      Excl. in GC? --    No data found.  Updated Vital Signs BP 117/74 (BP  Location: Right Arm)    Pulse 78    Temp 99.5 F (37.5 C) (Oral)    Resp 18    Ht 5\' 4"  (1.626 m)    Wt 169 lb 15.6 oz (77.1 kg)    LMP 12/08/2021 (Approximate)    SpO2 100%    Breastfeeding No    BMI 29.18 kg/m   Physical Exam Vitals and nursing note reviewed.  Constitutional:      General: She is not in acute distress.    Appearance: She is well-developed. She is not ill-appearing, toxic-appearing or diaphoretic.  HENT:     Head: Normocephalic and atraumatic.     Right Ear: Tympanic membrane normal. No middle ear effusion. Tympanic membrane is not erythematous.     Left Ear: A middle ear effusion is present. Tympanic membrane is erythematous.     Nose: No congestion or rhinorrhea.     Mouth/Throat:     Mouth: Mucous membranes are moist.     Pharynx: Uvula midline. Oropharyngeal exudate and posterior oropharyngeal erythema present. No pharyngeal swelling or uvula swelling.     Tonsils: Tonsillar exudate present. No tonsillar abscesses. 2+ on the right. 2+ on the left.  Cardiovascular:     Rate and Rhythm: Normal rate and regular rhythm.     Heart sounds: Normal heart sounds.  Pulmonary:     Effort: Pulmonary effort is normal.     Breath sounds: Normal breath sounds.  Musculoskeletal:     Cervical back: Normal range of motion and neck supple.  Lymphadenopathy:     Cervical: Cervical adenopathy present.  Skin:    General: Skin is warm.  Neurological:     Mental Status: She is alert and oriented to person, place, and time.     UC Treatments / Results  Labs (all labs ordered are listed, but only abnormal results are displayed) Labs Reviewed - No data to display  EKG   Radiology No results found.  Procedures Procedures (including critical care time)  Medications Ordered in UC Medications - No data to display  Initial Impression / Assessment and Plan / UC Course  I have reviewed  the triage vital signs and the nursing notes.  Pertinent labs & imaging results that were  available during my care of the patient were reviewed by me and considered in my medical decision making (see chart for details).     New. Clinically appears to have strep pharyngitis. Treating with Amoxil. Discussed how to prevent re-infection. Follow up as needed.  Final Clinical Impressions(s) / UC Diagnoses   Final diagnoses:  None   Discharge Instructions   None    ED Prescriptions   None    PDMP not reviewed this encounter.   Rushie Chestnut, [place] 12/08/21 [phone]

## 2023-10-30 LAB — BASIC METABOLIC PANEL WITH GFR: EGFR: 113

## 2023-11-12 ENCOUNTER — Encounter: Payer: Self-pay | Admitting: Cardiology

## 2023-11-19 ENCOUNTER — Encounter: Payer: Self-pay | Admitting: Cardiology

## 2024-02-21 ENCOUNTER — Ambulatory Visit: Payer: Self-pay | Admitting: Cardiology

## 2024-04-09 ENCOUNTER — Ambulatory Visit: Payer: Self-pay | Admitting: Cardiology

## 2024-06-09 ENCOUNTER — Ambulatory Visit: Payer: Self-pay | Admitting: Cardiovascular Disease

## 2024-06-11 ENCOUNTER — Ambulatory Visit: Payer: Self-pay | Admitting: Family Medicine

## 2024-07-09 NOTE — Progress Notes (Unsigned)
  Cardiology Office Note   Date:  07/11/2024  ID:  Holly Robles, DOB November 18, 1980, MRN [id number] PCP: Wellington Curtis LABOR, FNP  [facility] Providers Cardiologist:  Caron Poser, MD     History of Present Illness Holly Robles is a 43 y.o. female PMH reported ASD repair as a child who presents to establish care.  Patient reports that she had an ASD repair at [facility] as a child.  She has done well since then.  She used to follow with cardiology at a private group in [place], but has not seen anyone in quite a while.  She says she is intermittently has palpitations which has been attributed to PVCs previously.  These do not occur frequently and are not very bothersome.  She does not have any high risk features such as syncope.  She denies any exertional dyspnea or any signs or symptoms of heart failure.  Relevant CVD History -ASD repair at [facility] at age 65   ROS: Pt denies any chest discomfort, jaw pain, arm pain, palpitations, syncope, presyncope, orthopnea, PND, or LE edema.  Studies Reviewed I have independently reviewed the patient's ECG, previous medical records, and recent blood work.  Physical Exam VS:  BP 100/68 (BP Location: Right Arm, Patient Position: Sitting, Cuff Size: Normal)   Pulse 69   Ht 5' 4 (1.626 m)   Wt 180 lb 6.4 oz (81.8 kg)   SpO2 98%   BMI 30.97 kg/m        Wt Readings from Last 3 Encounters:  07/11/24 180 lb 6.4 oz (81.8 kg)  07/10/24 177 lb 9.6 oz (80.6 kg)  12/08/21 169 lb 15.6 oz (77.1 kg)    GEN: No acute distress. NECK: No JVD; No carotid bruits. CARDIAC: RRR, no murmurs, rubs, gallops. RESPIRATORY:  Clear to auscultation. EXTREMITIES:  Warm and well-perfused. No edema.  ASSESSMENT AND PLAN History of ASD repair as a child ASD repair at [facility] at 43 years old.  She has reportedly had normal echoes in the past.  She has no dyspnea or other signs or symptoms of heart failure.  For now, we can plan for intermittent surveillance unless new  symptoms arise.  Plan: - Echocardiogram now to establish baseline - Further surveillance pending symptoms  PVCs Palpitations Relatively uncommon.  No high risk features such as syncope.  She says she has had monitors before which just showed frequent PVCs.  Caffeine was a frequent trigger.  We can continue to monitor for now, we will obtain a monitor if these come back or if any other high risk features develop.  Cardiovascular risk counseling PCP is checking lipid profile.  She notes a family history of high cholesterol.  Can continue with intermittent surveillance with her primary care unless levels are significantly elevated.  She is overall low ASCVD risk at this time, so no clear indication for lipid-lowering therapy unless testing is suggestive of FH or she has other comorbidities such as diabetes, tobacco use, etc.. A1c is pending.         Dispo: RTC 3 years or sooner as needed  Signed, Caron Poser, MD

## 2024-07-10 ENCOUNTER — Encounter: Payer: Self-pay | Admitting: Family Medicine

## 2024-07-10 ENCOUNTER — Ambulatory Visit (INDEPENDENT_AMBULATORY_CARE_PROVIDER_SITE_OTHER): Payer: Self-pay | Admitting: Family Medicine

## 2024-07-10 VITALS — BP 91/76 | HR 78 | Temp 98.4°F | Ht 64.0 in | Wt 177.6 lb

## 2024-07-10 DIAGNOSIS — N951 Menopausal and female climacteric states: Secondary | ICD-10-CM

## 2024-07-10 DIAGNOSIS — G43909 Migraine, unspecified, not intractable, without status migrainosus: Secondary | ICD-10-CM

## 2024-07-10 DIAGNOSIS — M542 Cervicalgia: Secondary | ICD-10-CM

## 2024-07-10 DIAGNOSIS — Z1159 Encounter for screening for other viral diseases: Secondary | ICD-10-CM

## 2024-07-10 DIAGNOSIS — G8929 Other chronic pain: Secondary | ICD-10-CM

## 2024-07-10 DIAGNOSIS — F411 Generalized anxiety disorder: Secondary | ICD-10-CM

## 2024-07-10 DIAGNOSIS — Z7689 Persons encountering health services in other specified circumstances: Secondary | ICD-10-CM

## 2024-07-10 DIAGNOSIS — Z8774 Personal history of (corrected) congenital malformations of heart and circulatory system: Secondary | ICD-10-CM | POA: Diagnosis not present

## 2024-07-10 DIAGNOSIS — Q211 Atrial septal defect, unspecified: Secondary | ICD-10-CM | POA: Diagnosis not present

## 2024-07-10 DIAGNOSIS — Z1231 Encounter for screening mammogram for malignant neoplasm of breast: Secondary | ICD-10-CM

## 2024-07-10 DIAGNOSIS — Z6830 Body mass index (BMI) 30.0-30.9, adult: Secondary | ICD-10-CM

## 2024-07-10 DIAGNOSIS — E66811 Obesity, class 1: Secondary | ICD-10-CM | POA: Diagnosis not present

## 2024-07-10 DIAGNOSIS — E559 Vitamin D deficiency, unspecified: Secondary | ICD-10-CM

## 2024-07-10 DIAGNOSIS — E6609 Other obesity due to excess calories: Secondary | ICD-10-CM

## 2024-07-10 NOTE — Progress Notes (Signed)
 "  New Patient Office Visit  Introduced to nurse practitioner role and practice setting.  All questions answered.  Discussed provider/patient relationship and expectations.   Subjective    Patient ID: Holly Robles, female    DOB: 02/02/1981  Age: 42 y.o. MRN: [id number]  CC:  Chief Complaint  Patient presents with   New Patient (Initial Visit)    Primary care. Changing from a [facility] doctor due to job relocation. Will need yearly Rx refill and full bloodwork.  Vaccines declined  Pap [facility] in [place] - Last pap this year  Mammogram- ok to order wants [facility].   HPI  Discussed the use of AI scribe software for clinical note transcription with the patient, who gave verbal consent to proceed.  History of Present Illness Holly Robles is a 43 year old female who presents to establish care and for a general lab recheck.  She has a history of atrial septal defect repair at age [age] and experiences premature ventricular contractions (PVCs) and a 'flippy heart feeling,' which are regularly monitored. Her family history is significant for cardiac issues, including her grandmother's death from a massive heart attack, her grandfather's congestive heart failure, and her father's blockages and atrial fibrillation. Her calcium  score is zero, and she is not on statins.  She has a history of anxiety and depression, managed with Lexapro 20 mg daily. Panic attacks began after her brother's traumatic death about one year ago, and she uses hydroxyzine 25 mg as needed- not often using.  She experiences migraines, which have improved over the years but occasionally cause 'tunnel vision.'  She has hepatic cysts identified during a body scan, but follow-up ultrasounds could not locate them. She is not on medication for cholesterol as her calcium  score is zero. She has lost about 20 pounds recently and plans to lose another 20 pounds.  She has a vitamin D  deficiency,  which is monitored with her blood work. She also has a history of severe nausea during pregnancy, but this is not currently relevant.  She experiences arthritis and joint pain, and has a family history of joint replacements. She has degeneration in her neck.  Her surgical history includes a C-section and two colposcopies following abnormal Pap smears, which returned normal results.     Outpatient Encounter Medications as of 07/10/2024  Medication Sig   acetaminophen  (TYLENOL ) 500 MG tablet Take 1,000 mg by mouth every 6 (six) hours as needed for moderate pain.   ASHWAGANDHA PO Take by mouth.   b complex vitamins capsule Take 1 capsule by mouth daily.   Cholecalciferol (VITAMIN D -3 PO) Take by mouth.   Coenzyme Q10 (COQ10 PO) Take by mouth.   escitalopram (LEXAPRO) 20 MG tablet 1 tablet   estradiol (CLIMARA - DOSED IN MG/24 HR) 0.075 mg/24hr patch Place 0.075 mg onto the skin 2 (two) times a week.   folic acid (FOLVITE) 1 MG tablet Take 1 mg by mouth daily.   ibuprofen  (ADVIL ,MOTRIN ) 600 MG tablet Take 1 tablet (600 mg total) by mouth every 6 (six) hours.   metFORMIN (GLUCOPHAGE) 500 MG tablet Take 500 mg by mouth daily with breakfast.   Multiple Vitamin (MULTIVITAMIN) tablet Take 1 tablet by mouth daily.   progesterone (PROMETRIUM) 200 MG capsule Take 200 mg by mouth daily.   [DISCONTINUED] hydrOXYzine (VISTARIL) 25 MG capsule Take 25 mg by mouth as needed for anxiety. (Patient not taking: Reported on 07/11/2024)   [DISCONTINUED] busPIRone (BUSPAR) 5 MG tablet Take  5 mg by mouth 3 (three) times daily.   [DISCONTINUED] citalopram (CELEXA) 20 MG tablet Take 20 mg by mouth daily.   [DISCONTINUED] mupirocin  cream (BACTROBAN ) 2 % Apply 1 application topically 2 (two) times daily.   [DISCONTINUED] norethindrone-ethinyl estradiol-FE (LOESTRIN FE) 1-20 MG-MCG tablet Take 1 tablet by mouth daily.   [DISCONTINUED] predniSONE  (DELTASONE ) 10 MG tablet Start 60 mg po day one, then 50 mg po day two,  taper by 10 mg daily until complete.   No facility-administered encounter medications on file as of 07/10/2024.    Past Medical History:  Diagnosis Date   Abnormal Pap smear    Allergic rhinitis    Allergy    Arthritis    ASD (atrial septal defect)    Bruxism, sleep-related    Depression    Depression    Headache    Headache(784.0)    migraine   Hepatic cyst    History of atrial septal defect repair    Hx of female infertility    Hypercholesterolemia    Non-restorative sleep    Panic attack    Vitamin D  deficiency     Past Surgical History:  Procedure Laterality Date   ASD REPAIR     CESAREAN SECTION N/A 11/03/2013   Procedure: CESAREAN SECTION;  Surgeon: Dickie DELENA Carder, MD;  Location: [facility];  Service: Obstetrics;  Laterality: N/A;   COLPOSCOPY      Family History  Problem Relation Age of Onset   Hypothyroidism Mother    Hyperlipidemia Father    CAD Father    Atrial fibrillation Father    Seizures Sister    Graves' disease Brother    Hyperthyroidism Brother    Hypertension Brother    Hyperlipidemia Brother    Liver cancer Maternal Grandmother    Heart disease Maternal Grandfather    Heart attack Maternal Grandfather    Cancer Paternal Grandmother    Heart disease Paternal Grandfather    Heart failure Paternal Grandfather    Diabetes Maternal Uncle     Social History   Socioeconomic History   Marital status: Married    Spouse name: Not on file   Number of children: Not on file   Years of education: Not on file   Highest education level: Not on file  Occupational History   Not on file  Tobacco Use   Smoking status: Never   Smokeless tobacco: Never  Vaping Use   Vaping status: Never Used  Substance and Sexual Activity   Alcohol use: Yes    Comment: Occasional   Drug use: No   Sexual activity: Yes    Birth control/protection: None    Comment: Husband had vasectomy  Other Topics Concern   Not on file  Social History Narrative   ** Merged  History Encounter **       ** Merged History Encounter **       Social Drivers of Corporate Investment Banker Strain: Low Risk  (07/10/2024)   Overall Financial Resource Strain (CARDIA)    Difficulty of Paying Living Expenses: Not hard at all  Food Insecurity: No Food Insecurity (07/10/2024)   Hunger Vital Sign    Worried About Running Out of Food in the Last Year: Never true    Ran Out of Food in the Last Year: Never true  Transportation Needs: No Transportation Needs (07/10/2024)   PRAPARE - Administrator, Civil Service (Medical): No    Lack of Transportation (Non-Medical): No  Physical  Activity: Inactive (07/10/2024)   Exercise Vital Sign    Days of Exercise per Week: 0 days    Minutes of Exercise per Session: 0 min  Stress: Stress Concern Present (07/10/2024)   [employer] of Occupational Health - Occupational Stress Questionnaire    Feeling of Stress: To some extent  Social Connections: Moderately Integrated (07/10/2024)   Social Connection and Isolation Panel    Frequency of Communication with Friends and Family: More than three times a week    Frequency of Social Gatherings with Friends and Family: Once a week    Attends Religious Services: More than 4 times per year    Active Member of [employer] or Organizations: No    Attends Banker Meetings: Never    Marital Status: Married  Catering Manager Violence: Not At Risk (07/10/2024)   Humiliation, Afraid, Rape, and Kick questionnaire    Fear of Current or Ex-Partner: No    Emotionally Abused: No    Physically Abused: No    Sexually Abused: No    ROS      Objective    BP 91/76 (BP Location: Left Arm, Patient Position: Sitting, Cuff Size: Normal)   Pulse 78   Temp 98.4 F (36.9 C) (Oral)   Ht 5' 4 (1.626 m)   Wt 177 lb 9.6 oz (80.6 kg)   SpO2 99%   BMI 30.48 kg/m   Physical Exam Constitutional:      General: She is not in acute distress.    Appearance: Normal appearance. She is  overweight. She is not ill-appearing, toxic-appearing or diaphoretic.  HENT:     Head: Normocephalic.     Right Ear: Tympanic membrane, ear canal and external ear normal.     Left Ear: Tympanic membrane, ear canal and external ear normal.     Nose: Nose normal.     Mouth/Throat:     Mouth: Mucous membranes are moist.     Pharynx: Oropharynx is clear.  Eyes:     Extraocular Movements: Extraocular movements intact.     Pupils: Pupils are equal, round, and reactive to light.  Cardiovascular:     Rate and Rhythm: Normal rate and regular rhythm.     Pulses: Normal pulses.     Heart sounds: Normal heart sounds. No murmur heard.    No friction rub. No gallop.  Pulmonary:     Effort: No respiratory distress.     Breath sounds: No stridor. No wheezing, rhonchi or rales.  Chest:     Chest wall: No tenderness.  Abdominal:     Tenderness: There is no right CVA tenderness or left CVA tenderness.  Musculoskeletal:     Right lower leg: No edema.     Left lower leg: No edema.  Skin:    General: Skin is warm and dry.     Capillary Refill: Capillary refill takes less than 2 seconds.  Neurological:     General: No focal deficit present.     Mental Status: She is alert and oriented to person, place, and time. Mental status is at baseline.     Cranial Nerves: No cranial nerve deficit.     Motor: No weakness.  Psychiatric:        Attention and Perception: Attention and perception normal.        Mood and Affect: Mood and affect normal.        Speech: Speech normal.        Behavior: Behavior normal. Behavior is cooperative.  Thought Content: Thought content normal.        Cognition and Memory: Cognition and memory normal.        Judgment: Judgment normal.         Assessment & Plan:  Assessment and Plan Assessment & Plan New Doctor - Vitals stable - No major concerns, just establishing with new primary care office - Would like annual blood work - Order fasting blood work  including CBC, CMP, TSH, A1c, B12, vitamin D , lipid panel, hepatitis C, and HIV screening - Schedule follow-up in six months for routine check-up  Obesity, class 1 Class 1 obesity with recent weight loss of approximately 20 pounds. Actively working on [employer] through calorie reduction and lifestyle changes. - Continue current weight management strategies - Continue Metformin 500mg  for weight loss assistance - [facility]lis, FNP  Atrial septal defect, post-repair - Congenital - Repaired at age 35 - Establishing care with local Cardiologist for annual assessment - with ongoing cardiology follow-up and premature ventricular contractions  Generalized anxiety disorder  - With panic attacks - Continue prn atarax and daily Lexapro 20mg  with panic attacks and depression  Migraines  - sometimes will have tunnel vision with headaches - managed well right now  Cervical spondylosis with chronic neck pain and osteoarthritis Chronic neck pain due to cervical spondylosis and osteoarthritis. Degeneration noted in imaging per patient but currently well-managed. Monitored by chiropractor and ortho. - prn tylenol  and ibuprofen   Vitamin D  deficiency Vitamin D  deficiency. Regular monitoring through blood work. - Include vitamin D  level in upcoming blood work  Perimenopausal symptoms - on HRT - by [facility], Corlis, FNP - Estradiol patch - progesterone 200mg  daily Perimenopausal symptoms managed with hormone therapy, including estradiol patch and progesterone. Reports regular menstrual cycles and is satisfied with current management.  General Health Maintenance Routine health maintenance discussed, including mammogram scheduling and flu vaccine. - Order mammogram at [facility] - She declined flu vaccine   GAD (generalized anxiety disorder) -     TSH  Class 1 obesity due to excess calories without serious comorbidity with body mass index (BMI) of 30.0 to  30.9 in adult -     CBC with Differential/Platelet -     Comprehensive metabolic panel with GFR -     TSH -     Hemoglobin A1c -     Vitamin B12  Atrial septal defect -     Lipid panel  Status post atrial septal defect repair  Migraine without status migrainosus, not intractable, unspecified migraine type  Chronic neck pain  Perimenopause  Vitamin D  deficiency -     VITAMIN D  25 Hydroxy (Vit-D Deficiency, Fractures)  Breast cancer screening by mammogram -     3D Screening Mammogram, Left and Right  Screening for viral disease -     Hepatitis C antibody -     HIV Antibody (routine testing w rflx)  Encounter to establish care with new doctor    Return in about 6 months (around 01/07/2025) for Mood Mgmt.   Curtis DELENA Boom, FNP  "

## 2024-07-10 NOTE — Patient Instructions (Signed)

## 2024-07-11 ENCOUNTER — Ambulatory Visit: Payer: Self-pay

## 2024-07-11 ENCOUNTER — Encounter: Payer: Self-pay | Admitting: Family Medicine

## 2024-07-11 VITALS — BP 100/68 | HR 69 | Ht 64.0 in | Wt 180.4 lb

## 2024-07-11 DIAGNOSIS — I493 Ventricular premature depolarization: Secondary | ICD-10-CM

## 2024-07-11 DIAGNOSIS — Z7189 Other specified counseling: Secondary | ICD-10-CM | POA: Diagnosis not present

## 2024-07-11 DIAGNOSIS — R002 Palpitations: Secondary | ICD-10-CM | POA: Diagnosis not present

## 2024-07-11 DIAGNOSIS — Z8774 Personal history of (corrected) congenital malformations of heart and circulatory system: Secondary | ICD-10-CM | POA: Diagnosis not present

## 2024-07-11 NOTE — Patient Instructions (Signed)
 Medication Instructions:  Your physician recommends that you continue on your current medications as directed. Please refer to the Current Medication list given to you today.  *If you need a refill on your cardiac medications before your next appointment, please call your pharmacy*  Lab Work: No labs ordered today  If you have labs (blood work) drawn today and your tests are completely normal, you will receive your results only by: MyChart Message (if you have MyChart) OR A paper copy in the mail If you have any lab test that is abnormal or we need to change your treatment, we will call you to review the results.  Testing/Procedures: Your physician has requested that you have an echocardiogram. Echocardiography is a painless test that uses sound waves to create images of your heart. It provides your doctor with information about the size and shape of your heart and how well your heart's chambers and valves are working.   You may receive an ultrasound enhancing agent through an IV if needed to better visualize your heart during the echo. This procedure takes approximately one hour.  There are no restrictions for this procedure.  This will take place at [address] [facility] [address], [place] [address]  Please note: We ask at that you not bring children with you during ultrasound (echo/ vascular) testing. Due to room size and safety concerns, children are not allowed in the ultrasound rooms during exams. Our front office staff cannot provide observation of children in our lobby area while testing is being conducted. An adult accompanying a patient to their appointment will only be allowed in the ultrasound room at the discretion of the ultrasound technician under special circumstances. We apologize for any inconvenience.   Follow-Up: At [facility], you and your health needs are our priority.  As part of our continuing mission to provide you with exceptional heart  care, our providers are all part of one team.  This team includes your primary Cardiologist (physician) and Advanced Practice Providers or APPs (Physician Assistants and Nurse Practitioners) who all work together to provide you with the care you need, when you need it.  Your next appointment:   3 year(s) or AS NEEDED  Provider:   You may see Caron Poser, MD or one of the following Advanced Practice Providers on your designated Care Team:   Lonni Meager, NP Lesley Maffucci, PA-C Bernardino Bring, PA-C Cadence Delmar, PA-C Tylene Lunch, NP Barnie Hila, NP    We recommend signing up for the patient portal called MyChart.  Sign up information is provided on this After Visit Summary.  MyChart is used to connect with patients for Virtual Visits (Telemedicine).  Patients are able to view lab/test results, encounter notes, upcoming appointments, etc.  Non-urgent messages can be sent to your provider as well.   To learn more about what you can do with MyChart, go to [email or web address].

## 2024-07-12 LAB — COMPREHENSIVE METABOLIC PANEL WITH GFR
ALT: 16 IU/L (ref 0–32)
AST: 17 IU/L (ref 0–40)
Albumin: 4.2 g/dL (ref 3.9–4.9)
Alkaline Phosphatase: 60 IU/L (ref 44–121)
BUN/Creatinine Ratio: 19 (ref 9–23)
BUN: 12 mg/dL (ref 6–24)
Bilirubin Total: 0.4 mg/dL (ref 0.0–1.2)
CO2: 24 mmol/L (ref 20–29)
Calcium: 9.2 mg/dL (ref 8.7–10.2)
Chloride: 103 mmol/L (ref 96–106)
Creatinine, Ser: 0.62 mg/dL (ref 0.57–1.00)
Globulin, Total: 2.3 g/dL (ref 1.5–4.5)
Glucose: 86 mg/dL (ref 70–99)
Potassium: 4.9 mmol/L (ref 3.5–5.2)
Sodium: 139 mmol/L (ref 134–144)
Total Protein: 6.5 g/dL (ref 6.0–8.5)
eGFR: 115 mL/min/1.73 (ref >59)

## 2024-07-12 LAB — TSH: TSH: 2.01 u[IU]/mL (ref 0.450–4.500)

## 2024-07-12 LAB — HEMOGLOBIN A1C
Est. average glucose Bld gHb Est-mCnc: 97 mg/dL
Hgb A1c MFr Bld: 5 % (ref 4.8–5.6)

## 2024-07-12 LAB — VITAMIN D 25 HYDROXY (VIT D DEFICIENCY, FRACTURES): Vit D, 25-Hydroxy: 45.7 ng/mL (ref 30.0–100.0)

## 2024-07-12 LAB — CBC WITH DIFFERENTIAL/PLATELET
Basophils Absolute: 0 x10E3/uL (ref 0.0–0.2)
Basos: 1 %
EOS (ABSOLUTE): 0.1 x10E3/uL (ref 0.0–0.4)
Eos: 1 %
Hematocrit: 40.3 % (ref 34.0–46.6)
Hemoglobin: 13.4 g/dL (ref 11.1–15.9)
Immature Grans (Abs): 0 x10E3/uL (ref 0.0–0.1)
Immature Granulocytes: 0 %
Lymphocytes Absolute: 1.9 x10E3/uL (ref 0.7–3.1)
Lymphs: 35 %
MCH: 32.6 pg (ref 26.6–33.0)
MCHC: 33.3 g/dL (ref 31.5–35.7)
MCV: 98 fL — ABNORMAL HIGH (ref 79–97)
Monocytes Absolute: 0.5 x10E3/uL (ref 0.1–0.9)
Monocytes: 8 %
Neutrophils Absolute: 3 x10E3/uL (ref 1.4–7.0)
Neutrophils: 55 %
Platelets: 282 x10E3/uL (ref 150–450)
RBC: 4.11 x10E6/uL (ref 3.77–5.28)
RDW: 11.8 % (ref 11.7–15.4)
WBC: 5.5 x10E3/uL (ref 3.4–10.8)

## 2024-07-12 LAB — LIPID PANEL
Chol/HDL Ratio: 4.3 ratio (ref 0.0–4.4)
Cholesterol, Total: 206 mg/dL — ABNORMAL HIGH (ref 100–199)
HDL: 48 mg/dL (ref >39)
LDL Chol Calc (NIH): 146 mg/dL — ABNORMAL HIGH (ref 0–99)
Triglycerides: 65 mg/dL (ref 0–149)
VLDL Cholesterol Cal: 12 mg/dL (ref 5–40)

## 2024-07-12 LAB — HEPATITIS C ANTIBODY: Hep C Virus Ab: NONREACTIVE

## 2024-07-12 LAB — HIV ANTIBODY (ROUTINE TESTING W REFLEX): HIV Screen 4th Generation wRfx: NONREACTIVE

## 2024-07-12 LAB — VITAMIN B12: Vitamin B-12: 810 pg/mL (ref 232–1245)

## 2024-07-14 ENCOUNTER — Ambulatory Visit: Payer: Self-pay | Admitting: Family Medicine

## 2024-07-30 ENCOUNTER — Other Ambulatory Visit: Payer: Self-pay | Admitting: Medical Genetics

## 2024-07-31 ENCOUNTER — Encounter: Payer: Self-pay | Admitting: Family Medicine

## 2024-08-26 ENCOUNTER — Other Ambulatory Visit

## 2024-08-26 ENCOUNTER — Ambulatory Visit
Admission: RE | Admit: 2024-08-26 | Discharge: 2024-08-26 | Disposition: A | Discharge status: Home / Self Care | Source: Ambulatory Visit | Attending: Family Medicine | Admitting: Family Medicine

## 2024-08-26 ENCOUNTER — Inpatient Hospital Stay: Admission: RE | Admit: 2024-08-26 | Discharge: 2024-08-26 | Attending: Medical Genetics | Admitting: Medical Genetics

## 2024-08-26 DIAGNOSIS — Z1231 Encounter for screening mammogram for malignant neoplasm of breast: Secondary | ICD-10-CM | POA: Insufficient documentation

## 2024-08-29 ENCOUNTER — Other Ambulatory Visit: Payer: Self-pay | Admitting: Family Medicine

## 2024-08-29 ENCOUNTER — Ambulatory Visit

## 2024-08-29 DIAGNOSIS — R928 Other abnormal and inconclusive findings on diagnostic imaging of breast: Secondary | ICD-10-CM

## 2024-09-05 ENCOUNTER — Ambulatory Visit
Admission: RE | Admit: 2024-09-05 | Discharge: 2024-09-05 | Disposition: A | Discharge status: Home / Self Care | Source: Ambulatory Visit | Attending: Family Medicine | Admitting: Family Medicine

## 2024-09-05 ENCOUNTER — Ambulatory Visit: Payer: Self-pay | Admitting: Family Medicine

## 2024-09-05 DIAGNOSIS — R928 Other abnormal and inconclusive findings on diagnostic imaging of breast: Secondary | ICD-10-CM | POA: Insufficient documentation

## 2024-09-05 LAB — GENECONNECT MOLECULAR SCREEN: Genetic Analysis Overall Interpretation: NEGATIVE

## 2025-01-16 ENCOUNTER — Ambulatory Visit: Admitting: Family Medicine

## 2025-01-27 ENCOUNTER — Ambulatory Visit: Admitting: Family Medicine
